# Patient Record
Sex: Male | Born: 1970 | Race: Black or African American | Hispanic: No | Marital: Single | Smoking: Never smoker
Health system: Southern US, Community
[De-identification: ages and names within clinical notes are randomized; demographics above are authoritative.]

## PROBLEM LIST (undated history)

## (undated) DIAGNOSIS — F329 Major depressive disorder, single episode, unspecified: Secondary | ICD-10-CM

## (undated) DIAGNOSIS — F32A Depression, unspecified: Secondary | ICD-10-CM

## (undated) DIAGNOSIS — M545 Low back pain, unspecified: Secondary | ICD-10-CM

## (undated) DIAGNOSIS — G8929 Other chronic pain: Secondary | ICD-10-CM

## (undated) DIAGNOSIS — K219 Gastro-esophageal reflux disease without esophagitis: Secondary | ICD-10-CM

## (undated) DIAGNOSIS — I10 Essential (primary) hypertension: Secondary | ICD-10-CM

## (undated) HISTORY — DX: Essential (primary) hypertension: I10

## (undated) HISTORY — DX: Depression, unspecified: F32.A

## (undated) HISTORY — DX: Major depressive disorder, single episode, unspecified: F32.9

## (undated) HISTORY — DX: Low back pain: M54.5

## (undated) HISTORY — DX: Gastro-esophageal reflux disease without esophagitis: K21.9

## (undated) HISTORY — DX: Low back pain, unspecified: M54.50

## (undated) HISTORY — DX: Other chronic pain: G89.29

---

## 1999-07-23 ENCOUNTER — Emergency Department (HOSPITAL_COMMUNITY): Admission: EM | Admit: 1999-07-23 | Discharge: 1999-07-23 | Payer: Self-pay | Admitting: Emergency Medicine

## 1999-07-23 ENCOUNTER — Encounter: Payer: Self-pay | Admitting: Emergency Medicine

## 2000-10-27 ENCOUNTER — Encounter: Payer: Self-pay | Admitting: Emergency Medicine

## 2000-10-27 ENCOUNTER — Emergency Department (HOSPITAL_COMMUNITY): Admission: EM | Admit: 2000-10-27 | Discharge: 2000-10-27 | Payer: Self-pay | Admitting: Emergency Medicine

## 2001-04-24 ENCOUNTER — Emergency Department (HOSPITAL_COMMUNITY): Admission: EM | Admit: 2001-04-24 | Discharge: 2001-04-24 | Payer: Self-pay | Admitting: Internal Medicine

## 2001-08-25 ENCOUNTER — Emergency Department (HOSPITAL_COMMUNITY): Admission: EM | Admit: 2001-08-25 | Discharge: 2001-08-26 | Payer: Self-pay

## 2002-01-25 ENCOUNTER — Emergency Department (HOSPITAL_COMMUNITY): Admission: EM | Admit: 2002-01-25 | Discharge: 2002-01-25 | Payer: Self-pay | Admitting: Emergency Medicine

## 2002-09-20 ENCOUNTER — Emergency Department (HOSPITAL_COMMUNITY): Admission: EM | Admit: 2002-09-20 | Discharge: 2002-09-21 | Payer: Self-pay | Admitting: Emergency Medicine

## 2004-11-11 ENCOUNTER — Emergency Department (HOSPITAL_COMMUNITY): Admission: EM | Admit: 2004-11-11 | Discharge: 2004-11-11 | Payer: Self-pay | Admitting: Emergency Medicine

## 2004-12-20 ENCOUNTER — Encounter: Payer: Self-pay | Admitting: Family Medicine

## 2005-11-05 ENCOUNTER — Encounter: Payer: Self-pay | Admitting: Family Medicine

## 2006-07-07 ENCOUNTER — Encounter: Payer: Self-pay | Admitting: Family Medicine

## 2007-06-06 ENCOUNTER — Emergency Department (HOSPITAL_COMMUNITY): Admission: EM | Admit: 2007-06-06 | Discharge: 2007-06-06 | Payer: Self-pay | Admitting: Emergency Medicine

## 2008-07-03 ENCOUNTER — Ambulatory Visit: Payer: Self-pay | Admitting: Family Medicine

## 2008-07-04 ENCOUNTER — Ambulatory Visit: Payer: Self-pay | Admitting: *Deleted

## 2009-01-02 ENCOUNTER — Emergency Department (HOSPITAL_COMMUNITY): Admission: EM | Admit: 2009-01-02 | Discharge: 2009-01-02 | Payer: Self-pay | Admitting: Emergency Medicine

## 2009-01-05 ENCOUNTER — Emergency Department (HOSPITAL_COMMUNITY): Admission: EM | Admit: 2009-01-05 | Discharge: 2009-01-05 | Payer: Self-pay | Admitting: Emergency Medicine

## 2009-04-17 ENCOUNTER — Emergency Department (HOSPITAL_COMMUNITY): Admission: EM | Admit: 2009-04-17 | Discharge: 2009-04-17 | Payer: Self-pay | Admitting: Emergency Medicine

## 2009-04-18 ENCOUNTER — Ambulatory Visit: Payer: Self-pay | Admitting: Family Medicine

## 2009-04-23 ENCOUNTER — Emergency Department (HOSPITAL_COMMUNITY): Admission: EM | Admit: 2009-04-23 | Discharge: 2009-04-23 | Payer: Self-pay | Admitting: Emergency Medicine

## 2009-04-25 ENCOUNTER — Ambulatory Visit: Payer: Self-pay | Admitting: Family Medicine

## 2009-05-01 ENCOUNTER — Encounter: Payer: Self-pay | Admitting: Family Medicine

## 2009-06-26 ENCOUNTER — Encounter
Admission: RE | Admit: 2009-06-26 | Discharge: 2009-07-02 | Payer: Self-pay | Admitting: Physical Medicine & Rehabilitation

## 2009-06-28 ENCOUNTER — Ambulatory Visit: Payer: Self-pay | Admitting: Physical Medicine & Rehabilitation

## 2009-06-28 ENCOUNTER — Encounter: Payer: Self-pay | Admitting: Family Medicine

## 2010-04-17 ENCOUNTER — Ambulatory Visit: Payer: Self-pay | Admitting: Family Medicine

## 2010-04-17 DIAGNOSIS — M545 Low back pain, unspecified: Secondary | ICD-10-CM | POA: Insufficient documentation

## 2010-04-17 DIAGNOSIS — I10 Essential (primary) hypertension: Secondary | ICD-10-CM | POA: Insufficient documentation

## 2010-04-17 DIAGNOSIS — K219 Gastro-esophageal reflux disease without esophagitis: Secondary | ICD-10-CM

## 2010-04-17 DIAGNOSIS — F329 Major depressive disorder, single episode, unspecified: Secondary | ICD-10-CM

## 2010-04-17 DIAGNOSIS — F3289 Other specified depressive episodes: Secondary | ICD-10-CM

## 2010-04-17 HISTORY — DX: Major depressive disorder, single episode, unspecified: F32.9

## 2010-04-17 HISTORY — DX: Gastro-esophageal reflux disease without esophagitis: K21.9

## 2010-04-17 HISTORY — DX: Other specified depressive episodes: F32.89

## 2010-04-17 HISTORY — DX: Essential (primary) hypertension: I10

## 2010-04-17 HISTORY — DX: Low back pain, unspecified: M54.50

## 2010-05-08 ENCOUNTER — Ambulatory Visit: Payer: Self-pay | Admitting: Family Medicine

## 2010-05-21 ENCOUNTER — Ambulatory Visit: Payer: Self-pay | Admitting: Family Medicine

## 2010-06-03 ENCOUNTER — Telehealth: Payer: Self-pay | Admitting: Family Medicine

## 2010-06-05 ENCOUNTER — Encounter: Admission: RE | Admit: 2010-06-05 | Discharge: 2010-07-09 | Payer: Self-pay | Admitting: Family Medicine

## 2010-06-10 ENCOUNTER — Encounter: Payer: Self-pay | Admitting: Family Medicine

## 2010-06-20 ENCOUNTER — Telehealth: Payer: Self-pay | Admitting: Family Medicine

## 2010-06-20 ENCOUNTER — Ambulatory Visit: Payer: Self-pay | Admitting: Family Medicine

## 2010-06-23 ENCOUNTER — Encounter: Payer: Self-pay | Admitting: Family Medicine

## 2010-06-24 ENCOUNTER — Telehealth: Payer: Self-pay | Admitting: Family Medicine

## 2010-07-02 ENCOUNTER — Telehealth: Payer: Self-pay | Admitting: Family Medicine

## 2010-07-06 ENCOUNTER — Encounter: Admission: RE | Admit: 2010-07-06 | Discharge: 2010-07-06 | Payer: Self-pay | Admitting: Family Medicine

## 2010-07-07 ENCOUNTER — Telehealth: Payer: Self-pay | Admitting: Family Medicine

## 2010-07-10 ENCOUNTER — Telehealth: Payer: Self-pay | Admitting: Family Medicine

## 2010-07-18 ENCOUNTER — Telehealth: Payer: Self-pay | Admitting: Family Medicine

## 2010-07-29 ENCOUNTER — Encounter: Payer: Self-pay | Admitting: Family Medicine

## 2010-11-13 NOTE — Progress Notes (Signed)
Summary: rtc  Phone Note Call from Patient Call back at Home Phone [phone]   Caller: Patient Call For: Evelena Peat MD Summary of Call: pt is rtc deb call Initial call taken by: Heron Sabins,  July 02, 2010 11:58 AM  Follow-up for Phone Call        Pt agrees to have the MRI, and is requesting the open table. Follow-up by: Lynann Beaver CMA,  July 02, 2010 12:38 PM

## 2010-11-13 NOTE — Progress Notes (Signed)
Summary: Physical Functional Capacity form faxed to pt attorney  Phone Note Call from Patient Call back at Home Phone [phone]   Caller: Patient---live call Call For: Bryce Peat MD Summary of Call: was seen this morning. checking on status of paperwork. is it ready? wants nurse to return his call.  Follow-up for Phone Call        Called pt, no it is not ready, we've been busy seeing patients Sid Falcon LPN  June 20, 2010 4:41 PM   Additional Follow-up for Phone Call Additional follow up Details #1::        Pt informed Physical residual Functional Capicity Questionnaire form filed out.  Pt wanted for to be faxed to his attorney.  Pt has signed a release allowing [place] to fax to his attorney.  All documents will be scanned to EMR.  Pt requested a copy for his records. Paperwork faxed, confirmation received. Additional Follow-up by: Sid Falcon LPN,  June 23, 2010 10:08 AM

## 2010-11-13 NOTE — Miscellaneous (Signed)
Summary: Initial Summary for PT Services  Initial Summary for PT Services   Imported By: Maryln Gottron 06/13/2010 10:49:29  _____________________________________________________________________  External Attachment:    Type:   Image     Comment:   External Document

## 2010-11-13 NOTE — Assessment & Plan Note (Signed)
Summary: discuss other physical therapy options//ccm   Vital Signs:  Patient profile:   40 year old male Weight:      217 pounds Temp:     98.2 degrees F oral BP sitting:   120 / 83  (left arm) Cuff size:   regular  Vitals Entered By: Sid Falcon LPN (June 20, 2010 11:01 AM) CC: discuss options for chronic low back pain Is Patient Diabetic? No   History of Present Illness: Refer to prior notes.  Back pain is essentially unchanged.  He continues to have severe daily almost  chronic pain.  PT thus far has not helped any.   No new symptoms.  Temporary relief of pain with hydrocodone. He has not had MRI since 2006.  Does have some L radiculopathy symptoms.  He brings in some forms today regarding disability.  He states he is unable to  lift over 10 pounds, climb, bend or stoop, or stand/sit for prolonged periods of time. He has tried NSAIDS  and Ultram without relief of his back pain.  Allergies: 1)  ! Penicillin V Potassium (Penicillin V Potassium)  Past History:  Past Medical History: Last updated: 04/17/2010 Depression GERD Chronic low back pain PMH reviewed for relevance  Review of Systems  The patient denies anorexia, fever, weight loss, peripheral edema, abdominal pain, incontinence, and muscle weakness.    Physical Exam  General:  Well-developed,well-nourished,in no acute distress; alert,appropriate and cooperative throughout examination Lungs:  Normal respiratory effort, chest expands symmetrically. Lungs are clear to auscultation, no crackles or wheezes. Heart:  normal rate and regular rhythm.   Msk:  pos L straight leg raise lying but not sitting. Pulses:  good distal foot pulses. Extremities:  no edema and no muscle atrophy noted. Neurologic:  DTRs remain symmetric knee and ankle. Pt seems to have weakness with hip flexion, knee flexion, knee extension, plantar and dorsi flexion.   Impression & Recommendations:  Problem # 1:  LOW BACK PAIN,  CHRONIC (ICD-724.2) cont PT.  We discussed plan to repeat MRI of L-S spine if no improvements in back pain over the next few weeks.  Consider refer to chronic pain management. His updated medication list for this problem includes:    Meloxicam 15 Mg Tabs (Meloxicam) ..... One by mouth once daily    Hydrocodone-acetaminophen 5-325 Mg Tabs (Hydrocodone-acetaminophen) .Marland Kitchen... 1-2 by mouth q 6 hours as needed severe pain  Complete Medication List: 1)  Meloxicam 15 Mg Tabs (Meloxicam) .... One by mouth once daily 2)  Hydrocodone-acetaminophen 5-325 Mg Tabs (Hydrocodone-acetaminophen) .Marland Kitchen.. 1-2 by mouth q 6 hours as needed severe pain Prescriptions: HYDROCODONE-ACETAMINOPHEN 5-325 MG TABS (HYDROCODONE-ACETAMINOPHEN) 1-2 by mouth q 6 hours as needed severe pain  #30 x 0   Entered and Authorized by:   Evelena Peat MD   Signed by:   Evelena Peat MD on 06/20/2010   Method used:   Print then Give to Patient   RxID:   [id number]

## 2010-11-13 NOTE — Letter (Signed)
Summary: Records from [facility] 2006 - 2007  Records from [facility] 2006 - 2007   Imported By: Maryln Gottron 04/24/2010 13:32:07  _____________________________________________________________________  External Attachment:    Type:   Image     Comment:   External Document

## 2010-11-13 NOTE — Progress Notes (Signed)
Summary: Pt called req MRI results. Req call back today  Phone Note Call from Patient Call back at [facility] Phone [phone]   Caller: Patient Summary of Call: Pt called req MRI results. Pls call today if possible asap.   Initial call taken by: Lucy Antigua,  July 07, 2010 4:49 PM  Follow-up for Phone Call        Pt not at home, call again tomorrow Sid Falcon LPN  July 07, 2010 5:29 PM   Additional Follow-up for Phone Call Additional follow up Details #1::        Pt called in to obtain results for recent MRI.... Pt can be reached at  [phone].  Additional Follow-up by: Debbra Riding,  July 08, 2010 12:23 PM    Additional Follow-up for Phone Call Additional follow up Details #2::    Pt discussing MRI results with Dr Caryl Never now Sid Falcon LPN  July 08, 2010 1:07 PM Spoke with pt .  Proceed with referral to [facility].  Follow-up by: Evelena Peat MD,  July 08, 2010 1:15 PM

## 2010-11-13 NOTE — Assessment & Plan Note (Signed)
Summary: fu on back pain/njr   Vital Signs:  Patient profile:   40 year old male Weight:      215 pounds Temp:     98.4 degrees F oral Pulse rate:   78 / minute BP sitting:   120 / 80  (left arm) Cuff size:   regular  Vitals Entered By: Romualdo Bolk, CMA (AAMA) (May 21, 2010 1:58 PM) CC: Follow-up visit on back pain- Pt states that the mobic is not working   History of Present Illness: Patient here for followup regarding back pain. We did finally receive some notes from pain management.  Patient was seen there last September. He had seen previous orthopedists and neurosurgeon. Very limited old records still for review. He had reported work injury 2006. 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 Patient has had chronic pain since then. Neurosurgery was not recommended by neurosurgeon. There had been some consideration of left S1 nerve root blocks but patient never went for that.  He has been on multiple medications per previous notes including Neurontin ,tramadol, muscle relaxers , and nonsteroidals without improvement. Small relief with hydrocodone and oxycodone.  Patient has daily pain 8/10 severity. Pain with prolonged periods of sitting, standing or walking. Pain is sharp ,stabbing, prickly, deep achy pain with radiation left lower extremity greater than right. Occasional radiation right buttock. No incontinence symptoms.  There have been recommendations to consider physical therapy but he never went. It was felt that deconditioning might improve with physical therapy.  Preventive Screening-Counseling & Management  Alcohol-Tobacco     Alcohol drinks/day: 0     Smoking Status: never  Caffeine-Diet-Exercise     Caffeine use/day: 0     Does Patient Exercise: no  Current Medications (verified): 1)  Meloxicam 15 Mg Tabs (Meloxicam) .... One By Mouth Once Daily  Allergies (verified): 1)  ! Penicillin V  Potassium (Penicillin V Potassium)  Past History:  Past Medical History: Last updated: 04/17/2010 Depression GERD Chronic low back pain PMH reviewed for relevance  Social History: Caffeine use/day:  0  Review of Systems      See HPI       The patient complains of weight gain.  The patient denies anorexia, fever, weight loss, chest pain, syncope, dyspnea on exertion, abdominal pain, melena, hematochezia, incontinence, and muscle weakness.    Physical Exam  General:  Well-developed,well-nourished,in no acute distress; alert,appropriate and cooperative throughout examination Lungs:  Normal respiratory effort, chest expands symmetrically. Lungs are clear to auscultation, no crackles or wheezes. Heart:  Normal rate and regular rhythm. S1 and S2 normal without gallop, murmur, click, rub or other extra sounds. Msk:  patient has very limited range of motion back secondary to pain. Extension only about 5 flexion of about 30 at best. Straight leg raises are negative. Neurologic:  deep reflexes remain 2+ knee and ankle bilaterally. No sensory impairment   Impression & Recommendations:  Problem # 1:  LOW BACK PAIN, CHRONIC (ICD-724.2) schedule PT.  Limited hydrocodone.  Schedule CPE. His updated medication list for this problem includes:    Meloxicam 15 Mg Tabs (Meloxicam) ..... One by mouth once daily    Hydrocodone-acetaminophen 5-325 Mg Tabs (Hydrocodone-acetaminophen) .Marland Kitchen... 1-2 by mouth q 6 hours as needed severe pain  Orders: Physical Therapy Referral (PT)  Complete Medication List: 1)  Meloxicam 15 Mg Tabs (Meloxicam) .... One by mouth once daily 2)  Hydrocodone-acetaminophen 5-325 Mg Tabs (Hydrocodone-acetaminophen) .Marland Kitchen.. 1-2 by  mouth q 6 hours as needed severe pain  Patient Instructions: 1)  Schedule followup appointment in 3-4 weeks Prescriptions: HYDROCODONE-ACETAMINOPHEN 5-325 MG TABS (HYDROCODONE-ACETAMINOPHEN) 1-2 by mouth q 6 hours as needed severe pain  #60 x 0    Entered and Authorized by:   Evelena Peat MD   Signed by:   Evelena Peat MD on 05/21/2010   Method used:   Print then Give to Patient   RxID:   [id number]

## 2010-11-13 NOTE — Letter (Signed)
Summary: Records from [facility] 2009 - 2010  Records from [facility] 2009 - 2010   Imported By: Maryln Gottron 06/12/2010 14:28:50  _____________________________________________________________________  External Attachment:    Type:   Image     Comment:   External Document

## 2010-11-13 NOTE — Assessment & Plan Note (Signed)
Summary: 3 wk rov/njr   Vital Signs:  Patient profile:   40 year old male Weight:      210 pounds Temp:     98.6 degrees F oral BP sitting:   118 / 84  (left arm) Cuff size:   large  Vitals Entered By: Sid Falcon LPN (May 08, 2010 9:59 AM) CC: 3 week follow-up, ongoing back pain   History of Present Illness: Patient is here for followup regarding chronic low back pain. We have requested old records and have received only one neurosurgical evaluation from neurosurgeon for visit back in 2007.  Patient apparently had a work injury in 2006. He saw orthopedist and at one point was offered surgery for L5-S1 disc.  back pain sharp stinging quality with radiation to left lower extremity. He had no imaging since 2006. He has frequent back spasms. Rate severity pain 8 out of 10. Worse with bending or lifting. Some pain at rest. Has tried multiple medications including Ultram, meloxicam, and nabumetone without relief. His only pain relief minimally with Percocet in the past.S1 disc protrusion. He has not worked since then. He continues to have chronic daily pain.  Has been seen at [facility] and [facility] previously. We have none of those records at this time. He apparently was discharged from pain management and not clear why. We have explained to patient that we are unable to prescribe any controlled medicines until we get to his records.  Allergies: 1)  ! Penicillin V Potassium (Penicillin V Potassium)  Past History:  Past Medical History: Last updated: 04/17/2010 Depression GERD Chronic low back pain  Physical Exam  General:  Well-developed,well-nourished,in no acute distress; alert,appropriate and cooperative throughout examination Neck:  No deformities, masses, or tenderness noted. Lungs:  Normal respiratory effort, chest expands symmetrically. Lungs are clear to auscultation, no crackles or wheezes. Heart:  Normal rate and regular rhythm. S1 and S2 normal without  gallop, murmur, click, rub or other extra sounds. Abdomen:  soft and non-tender.   Extremities:  no muscle atrophy. Straight leg raise is negative. Neurologic:  ankle and knee reflexes are 2+ bilaterally. Poor effort with strength testing throughout left lower extremity.   Impression & Recommendations:  Problem # 1:  LOW BACK PAIN, CHRONIC (ICD-724.2) Explained we are unable to prescribe controlled medications until we obtain more old records. He has chronic back pain over several years and would be best suited to be evaluated and treated in chronic pain management Center His updated medication list for this problem includes:    Meloxicam 15 Mg Tabs (Meloxicam) ..... One by mouth once daily  Complete Medication List: 1)  Meloxicam 15 Mg Tabs (Meloxicam) .... One by mouth once daily  Patient Instructions: 1)  We will send for old records from [facility] and [facility] and be in touch when those have arrived

## 2010-11-13 NOTE — Letter (Signed)
[facility]   Imported By: Maryln Gottron 05/26/2010 10:21:22  _____________________________________________________________________  External Attachment:    Type:   Image     Comment:   External Document

## 2010-11-13 NOTE — Progress Notes (Signed)
Summary: Pt req refill of Hydrocodone to [facility]  Phone Note Refill Request Call back at Home Phone [phone] Message from:  Patient on July 10, 2010 2:06 PM  Refills Requested: Medication #1:  HYDROCODONE-ACETAMINOPHEN 5-325 MG TABS 1-2 by mouth q 6 hours as needed severe pain.   Dosage confirmed as above?Dosage Confirmed Pt needs enough med to last until his appt with Neurologist on November 11,2011   Method Requested: pls call in to [facility] [address] [address] Initial call taken by: Lucy Antigua,  July 10, 2010 2:06 PM  Follow-up for Phone Call        We have discussed previously.  He was not on any opioids for years until he saw [place].  His recent MRI does not clearly explain his chronic L radiculopathy pain.  At this point I do not feel he should be on chronic opioids.  We tried Ultram without relief.  He should try Motrin or Alleve and   take with food. Follow-up by: Evelena Peat MD,  July 10, 2010 9:05 PM  Additional Follow-up for Phone Call Additional follow up Details #1::        Pt informed Additional Follow-up by: Sid Falcon LPN,  July 11, 2010 2:45 PM

## 2010-11-13 NOTE — Progress Notes (Signed)
Summary: med refill  Hydrocodone #30  Phone Note Call from Patient Call back at Home Phone [phone]   Refills Requested: Medication #1:  HYDROCODONE-ACETAMINOPHEN 5-325 MG TABS 1-2 by mouth q 6 hours as needed severe pain.   Dosage confirmed as above?Dosage Confirmed Caller: Patient Summary of Call: Pt called and is going to be starting Physical Therapy on 06/05/10. Pt is req that the mg dose of Hydrocodone be increased. Pls call in to [facility] on [address]  Pls notify pt when this has been done, or if there are questions.  Initial call taken by: Lucy Antigua,  June 03, 2010 1:06 PM  Follow-up for Phone Call        I do not recommend increased dose at this time.  Pt was not on ANY pain meds prior to starting this and we need to focus on PT and other non-narcotic treatments for his pain.  If he is not responding to PT we will plan on repeat MRI scan and go from there. Follow-up by: Evelena Peat MD,  June 03, 2010 1:49 PM  Additional Follow-up for Phone Call Additional follow up Details #1::        I called pt and told him that Dr. Caryl Never does not want to increase dose of pain at this time. Pt is req to get a refill of the Hydrocodone 5-325mg . Pt is completely out of med.        Additional Follow-up by: Lucy Antigua,  June 03, 2010 2:27 PM    Additional Follow-up for Phone Call Additional follow up Details #2::    Pt callback concerning  refill on hydrocodone [facility] [address]. Pt is aware doc out of office until tomorrow 06-04-10  Follow-up by: Heron Sabins,  June 03, 2010 4:41 PM  Additional Follow-up for Phone Call Additional follow up Details #3:: Details for Additional Follow-up Action Taken: will refill once (for hydrocodone) # 30 as patient starts phys therapy but we need to make clear to patient  that narcotic treatment should not be our first option.  If he is not seeing improvement with PT we will consider repeat MRI scan and consideration for  referral of specialist/pain management after that.  Rx called in, pt informed on personally identified work VM Sid Falcon LPN  June 05, 2010 9:36 AM  Additional Follow-up by: Evelena Peat MD,  June 05, 2010 8:18 AM  Prescriptions: HYDROCODONE-ACETAMINOPHEN 5-325 MG TABS (HYDROCODONE-ACETAMINOPHEN) 1-2 by mouth q 6 hours as needed severe pain  #30 x 0   Entered by:   Sid Falcon LPN   Authorized by:   Evelena Peat MD   Signed by:   Sid Falcon LPN on 91/47/8295   Method used:   Telephoned to ...       [facility]  [address] # [id number]* (retail)       [address]       [place], [place]  [address]       Ph: [phone] or [phone]       Fax: [phone]   RxID:   [id number]

## 2010-11-13 NOTE — Progress Notes (Signed)
Summary: denied refill pain med   Phone Note Refill Request Message from:  Fax from Pharmacy on July 18, 2010 2:25 PM  Refills Requested: Medication #1:  HYDROCODONE-ACETAMINOPHEN 5-325 MG TABS 1-2 by mouth q 6 hours as needed severe pain. [facility] [address]   Initial call taken by: Duard Brady LPN,  July 18, 2010 2:26 PM  Follow-up for Phone Call        [facility] - spoke with stephanie - the tech - informed that Dr. Caryl Never denied refill on hydrocodone acetaminophen and pt had been informed. KIk Follow-up by: Duard Brady LPN,  July 18, 2010 2:30 PM

## 2010-11-13 NOTE — Assessment & Plan Note (Signed)
Summary: BRAND NEW PT/TO EST/PT REQ CPX/PT FASTING/CJR   Vital Signs:  Patient profile:   40 year old male Height:      68.50 inches Weight:      212 pounds BMI:     31.88 Temp:     98.2 degrees F oral Pulse rate:   80 / minute Pulse rhythm:   regular Resp:     12 per minute BP sitting:   130 / 90  (left arm) Cuff size:   regular  Vitals Entered By: Sid Falcon LPN (April 17, 346 10:26 AM)  Nutrition Counseling: Patient's BMI is greater than 25 and therefore counseled on weight management options.  Serial Vital Signs/Assessments:  Time      Position  BP       Pulse  Resp  Temp     By 10:43 AM            140/82                         Sid Falcon LPN  CC: New to establish, ongoing back pain   History of Present Illness: Here to establish care.  Chronic back pain following work injury 2006.  Chronic pain L back with some radiculopathy symtpoms. Last MRI 2006.  Some weakness LLE.   Currently on no meds. Hx of rehab and pain management and at this time not clear why he was discharged from their care.  pain is rated 8/10 at times.  Pain with prolonged sitting or standing.  Advil and Alleve without relief. Prescription NSAIDS and tramadol without relief in past.   Has seen Dr Ethelene Hal and Dr Larna Daughters in past.  No hx of surgery.  Hx of reported mild GERD symptoms.  Controlled with OTC meds.  Preventive Screening-Counseling & Management  Alcohol-Tobacco     Alcohol drinks/day: 0     Smoking Status: never  Caffeine-Diet-Exercise     Does Patient Exercise: no  Allergies (verified): 1)  ! Penicillin V Potassium (Penicillin V Potassium)  Past History:  Family History: Last updated: 04/17/2010 mother, heart disease, hypertension, type ll diabetes  Social History: Last updated: 04/17/2010 Occupation:  Unemployed Single Never Smoked Alcohol use-no Regular exercise-no  Risk Factors: Alcohol Use: 0 (04/17/2010) Exercise: no (04/17/2010)  Risk Factors: Smoking  Status: never (04/17/2010)  Past Medical History: Depression GERD Chronic low back pain PMH-FH-SH reviewed for relevance  Family History: mother, heart disease, hypertension, type ll diabetes  Social History: Occupation:  Unemployed Single Never Smoked Alcohol use-no Regular exercise-no Smoking Status:  never Occupation:  employed Does Patient Exercise:  no  Review of Systems       The patient complains of weight gain.  The patient denies anorexia, fever, weight loss, chest pain, syncope, dyspnea on exertion, peripheral edema, prolonged cough, headaches, hemoptysis, abdominal pain, melena, hematochezia, and incontinence.    Physical Exam  General:  Well-developed,well-nourished,in no acute distress; alert,appropriate and cooperative throughout examination Head:  Normocephalic and atraumatic without obvious abnormalities. No apparent alopecia or balding. Eyes:  pupils equal, pupils round, and pupils reactive to light.   Mouth:  Oral mucosa and oropharynx without lesions or exudates.  Teeth in good repair. Neck:  No deformities, masses, or tenderness noted. Lungs:  Normal respiratory effort, chest expands symmetrically. Lungs are clear to auscultation, no crackles or wheezes. Heart:  Normal rate and regular rhythm. S1 and S2 normal without gallop, murmur, click, rub or other extra sounds. Msk:  straight  leg raises neg. Extremities:  no edema. Neurologic:  DTRs symmetrical and normal.  ?effort with strength testing LLE.  Normal sensory feet and legs.   Impression & Recommendations:  Problem # 1:  LOW BACK PAIN, CHRONIC (ICD-724.2) send for old records.  Start meloxicam and schedule follow up in one months.  pt inquires about pain meds.  We need to explore all non-opioid treatment options first. His updated medication list for this problem includes:    Meloxicam 15 Mg Tabs (Meloxicam) ..... One by mouth once daily  Problem # 2:  GERD (ICD-530.81)  Complete Medication  List: 1)  Meloxicam 15 Mg Tabs (Meloxicam) .... One by mouth once daily  Patient Instructions: 1)  Schedule follow up in 3-4 weeks. Prescriptions: MELOXICAM 15 MG TABS (MELOXICAM) one by mouth once daily  #30 x 3   Entered and Authorized by:   Evelena Peat MD   Signed by:   Evelena Peat MD on 04/17/2010   Method used:   Electronically to        [facility]  [address] [id number]* (retail)       [address]       [place], [place]  [address]       Ph: [phone] or [phone]       Fax: [phone]   RxID:   [id number]

## 2010-11-13 NOTE — Progress Notes (Signed)
Summary: Pt has some info re: form that was faxed  Phone Note Call from Patient Call back at Home Phone [phone]   Caller: Patient Summary of Call: Pt called and said that their was a question that Dr. Early Osmond didnt understand and pt has gotten the answer. Pls call.  Initial call taken by: Lucy Antigua,  June 24, 2010 11:28 AM  Follow-up for Phone Call        Pt informed question #14 completed yesterday and faxed to his attorney Follow-up by: Sid Falcon LPN,  June 24, 2010 12:38 PM

## 2010-11-13 NOTE — Letter (Signed)
Summary: Physical Residual Functional Capacity Questionnaire  Physical Residual Functional Capacity Questionnaire   Imported By: Maryln Gottron 06/27/2010 15:00:48  _____________________________________________________________________  External Attachment:    Type:   Image     Comment:   External Document

## 2010-11-13 NOTE — Letter (Signed)
Summary: [facility]   [facility]   Imported By: Maryln Gottron 05/13/2010 12:38:58  _____________________________________________________________________  External Attachment:    Type:   Image     Comment:   External Document

## 2010-11-14 NOTE — Miscellaneous (Signed)
Summary: Discharge Summary for PT [facility]  Discharge Summary for PT Services/[facility]   Imported By: Maryln Gottron 08/06/2010 10:39:53  _____________________________________________________________________  External Attachment:    Type:   Image     Comment:   External Document

## 2010-11-28 DIAGNOSIS — Z0289 Encounter for other administrative examinations: Secondary | ICD-10-CM

## 2010-12-01 ENCOUNTER — Encounter: Payer: Self-pay | Admitting: Family Medicine

## 2010-12-25 ENCOUNTER — Other Ambulatory Visit: Payer: Medicare Other | Admitting: Family Medicine

## 2010-12-25 DIAGNOSIS — R972 Elevated prostate specific antigen [PSA]: Secondary | ICD-10-CM

## 2010-12-25 DIAGNOSIS — E785 Hyperlipidemia, unspecified: Secondary | ICD-10-CM

## 2010-12-25 DIAGNOSIS — D649 Anemia, unspecified: Secondary | ICD-10-CM

## 2010-12-25 DIAGNOSIS — E039 Hypothyroidism, unspecified: Secondary | ICD-10-CM

## 2010-12-25 DIAGNOSIS — I10 Essential (primary) hypertension: Secondary | ICD-10-CM

## 2010-12-25 DIAGNOSIS — T887XXA Unspecified adverse effect of drug or medicament, initial encounter: Secondary | ICD-10-CM

## 2010-12-25 DIAGNOSIS — Z0000 Encounter for general adult medical examination without abnormal findings: Secondary | ICD-10-CM

## 2010-12-25 LAB — POCT URINALYSIS DIPSTICK
Bilirubin, UA: NEGATIVE
Blood, UA: NEGATIVE
Glucose, UA: NEGATIVE
Leukocytes, UA: NEGATIVE
Nitrite, UA: NEGATIVE

## 2010-12-25 LAB — TSH: TSH: 1.25 u[IU]/mL (ref 0.35–5.50)

## 2010-12-25 LAB — LDL CHOLESTEROL, DIRECT: Direct LDL: 184.9 mg/dL

## 2010-12-25 LAB — CBC WITH DIFFERENTIAL/PLATELET
Basophils Relative: 0.5 % (ref 0.0–3.0)
Eosinophils Relative: 5 % (ref 0.0–5.0)
HCT: 42.8 % (ref 39.0–52.0)
MCV: 84.5 fl (ref 78.0–100.0)
Monocytes Absolute: 0.4 10*3/uL (ref 0.1–1.0)
Neutrophils Relative %: 49.4 % (ref 43.0–77.0)
RBC: 5.06 Mil/uL (ref 4.22–5.81)
WBC: 4.9 10*3/uL (ref 4.5–10.5)

## 2010-12-25 LAB — HEPATIC FUNCTION PANEL
ALT: 25 U/L (ref 0–53)
Bilirubin, Direct: 0.1 mg/dL (ref 0.0–0.3)
Total Protein: 7 g/dL (ref 6.0–8.3)

## 2010-12-25 LAB — BASIC METABOLIC PANEL
Chloride: 107 mEq/L (ref 96–112)
Creatinine, Ser: 1.3 mg/dL (ref 0.4–1.5)
Potassium: 4.8 mEq/L (ref 3.5–5.1)

## 2010-12-25 LAB — PSA: PSA: 1.47 ng/mL (ref 0.10–4.00)

## 2010-12-25 LAB — LIPID PANEL: Triglycerides: 46 mg/dL (ref 0.0–149.0)

## 2011-01-06 ENCOUNTER — Encounter: Payer: Self-pay | Admitting: Family Medicine

## 2011-01-08 ENCOUNTER — Ambulatory Visit (INDEPENDENT_AMBULATORY_CARE_PROVIDER_SITE_OTHER): Payer: Medicare Other | Admitting: Family Medicine

## 2011-01-08 ENCOUNTER — Encounter: Payer: Self-pay | Admitting: Family Medicine

## 2011-01-08 VITALS — BP 104/80 | HR 80 | Temp 99.0°F | Resp 12 | Ht 69.0 in | Wt 203.0 lb

## 2011-01-08 DIAGNOSIS — Z0000 Encounter for general adult medical examination without abnormal findings: Secondary | ICD-10-CM

## 2011-01-08 DIAGNOSIS — M545 Low back pain: Secondary | ICD-10-CM

## 2011-01-08 DIAGNOSIS — G8929 Other chronic pain: Secondary | ICD-10-CM

## 2011-01-08 MED ORDER — TETANUS-DIPHTH-ACELL PERTUSSIS 5-2.5-18.5 LF-MCG/0.5 IM SUSP: 0.5000 mL | Freq: Once | INTRAMUSCULAR | Status: AC

## 2011-01-08 MED ORDER — HYDROCODONE-ACETAMINOPHEN 5-325 MG PO TABS: 1.0000 | ORAL_TABLET | Freq: Four times a day (QID) | ORAL | Status: DC | PRN

## 2011-01-08 NOTE — Progress Notes (Signed)
  Subjective:    Patient ID: Bryce Cabrera, male    DOB: 02/10/1971, 40 y.o.   MRN: [id number]  HPI Patient is seen for physical exam/wellness visit.  Also has chronic low back pain.  He has recently lost some weight due to his efforts.  Recently approved for disability (per pt) based on some chronic low back pain.  Uses TENS unit but still has significant pain.  Very limited exercise. Low back pain is generalized and no recent weakness.  Has not seen relief previously with NSAIDS or tramadol.  Pain worse with back flexion and change of position.  Tetanus unknown but he feels over 10 years ago.  PMH, SH, AND FH reviewed.   Review of Systems  Constitutional: Negative for fever, activity change, appetite change and fatigue.  HENT: Negative for ear pain, congestion and trouble swallowing.   Eyes: Negative for pain and visual disturbance.  Respiratory: Negative for cough, shortness of breath and wheezing.   Cardiovascular: Negative for chest pain and palpitations.  Gastrointestinal: Negative for nausea, vomiting, abdominal pain, diarrhea, constipation, blood in stool, abdominal distention and rectal pain.  Genitourinary: Negative for dysuria, hematuria and testicular pain.  Musculoskeletal: Positive for back pain. Negative for joint swelling and arthralgias.  Skin: Negative for rash.  Neurological: Negative for dizziness, syncope and headaches.  Hematological: Negative for adenopathy.  Psychiatric/Behavioral: Negative for confusion and dysphoric mood.       Objective:   Physical Exam  Constitutional: He is oriented to person, place, and time. He appears well-developed and well-nourished. No distress.  HENT:  Head: Normocephalic and atraumatic.  Right Ear: External ear normal.  Left Ear: External ear normal.  Mouth/Throat: Oropharynx is clear and moist.  Eyes: Conjunctivae and EOM are normal. Pupils are equal, round, and reactive to light.  Neck: Normal range of motion. Neck supple.  No thyromegaly present.  Cardiovascular: Normal rate, regular rhythm and normal heart sounds.   No murmur heard. Pulmonary/Chest: No respiratory distress. He has no wheezes. He has no rales.  Abdominal: Soft. Bowel sounds are normal. He exhibits no distension and no mass. There is no tenderness. There is no rebound and no guarding.  Musculoskeletal: He exhibits no edema.  Lymphadenopathy:    He has no cervical adenopathy.  Neurological: He is alert and oriented to person, place, and time. He displays normal reflexes. No cranial nerve deficit.  Skin: No rash noted.  Psychiatric: He has a normal mood and affect.          Assessment & Plan:  #1  Wellness exam.  Labs reviewed with pt.  Elevated lipids.  He prefers attempt at further weight loss and reassess in 6 months.  Tdap given. #2  Chronic low back pain.  Pt requesting additional pain meds.  We wrote for limited Vicodin but expressed our concern about long term opioids.

## 2011-01-08 NOTE — Patient Instructions (Signed)
Continue to work on weight loss and we need to repeat your lipids in 6 months.

## 2011-01-14 ENCOUNTER — Telehealth: Payer: Self-pay | Admitting: Family Medicine

## 2011-01-14 DIAGNOSIS — M545 Low back pain: Secondary | ICD-10-CM

## 2011-01-14 NOTE — Telephone Encounter (Signed)
Pt has 8 pills remaining on the Hydrocodone. Pt has been taking 2 every 6 hrs for pain. Pt is req refill. Pt doesn't have another ov with Dr Caryl Never for 6 month. Pt req refills to last until appt.

## 2011-01-15 NOTE — Telephone Encounter (Signed)
This cannot be refilled at this time.  I have had multiple discussions with patient. He should not be taking this frequently.  I am uncomfortable with long term opioid use with his situation.  I am happy to refer back to pain management if he is interested.

## 2011-01-15 NOTE — Telephone Encounter (Signed)
Pt informed and he is in agreement for referral to [facility].  He does not want to go back to pain management he has been to in the past

## 2011-01-15 NOTE — Telephone Encounter (Signed)
OK to proceed with referral to [facility].

## 2011-01-15 NOTE — Telephone Encounter (Signed)
Last filled on 3/29, #40 with 0 refills

## 2011-01-22 LAB — DIFFERENTIAL
Basophils Absolute: 0 10*3/uL (ref 0.0–0.1)
Basophils Relative: 0 % (ref 0–1)
Lymphocytes Relative: 7 % — ABNORMAL LOW (ref 12–46)
Monocytes Absolute: 1.6 10*3/uL — ABNORMAL HIGH (ref 0.1–1.0)
Neutro Abs: 12.9 10*3/uL — ABNORMAL HIGH (ref 1.7–7.7)

## 2011-01-22 LAB — POCT I-STAT, CHEM 8
Calcium, Ion: 1.15 mmol/L (ref 1.12–1.32)
Chloride: 104 mEq/L (ref 96–112)
HCT: 51 % (ref 39.0–52.0)
Sodium: 138 mEq/L (ref 135–145)
TCO2: 26 mmol/L (ref 0–100)

## 2011-01-22 LAB — CBC
Hemoglobin: 15.6 g/dL (ref 13.0–17.0)
MCHC: 33.1 g/dL (ref 30.0–36.0)
Platelets: 247 10*3/uL (ref 150–400)
RDW: 14.4 % (ref 11.5–15.5)

## 2011-01-22 LAB — RAPID STREP SCREEN (MED CTR MEBANE ONLY): Streptococcus, Group A Screen (Direct): NEGATIVE

## 2011-02-24 NOTE — Consult Note (Signed)
NAMEORIAN, FIGUEIRA NO.:  [id number]   MEDICAL RECORD NO.:  [id number]          PATIENT TYPE:  EMS   LOCATION:  ED                           FACILITY:  [facility]   PHYSICIAN:  Antony Contras, MD     DATE OF BIRTH:  01-27-1971   DATE OF CONSULTATION:  01/05/2009  DATE OF DISCHARGE:                                 CONSULTATION   CHIEF COMPLAINT:  Throat pain.   HISTORY OF PRESENT ILLNESS:  The patient is a 40 year old African  American male who developed a sore throat with swelling about 5 days ago  and was found to have pus on his tonsils.  He presented to the emergency  department 3 days ago with those symptoms and was prescribed  azithromycin.  He has not noticed any improvement.  In fact, has gotten  worse to the point of difficulty swallowing due to pain and swelling as  well as some difficulty breathing.  Pain is on the left side and, when  he presented to the emergency department today, his pain was 10/10.  After medications, it is now 2/10.  His voice is hoarse but he is  otherwise without complaint.   PAST MEDICAL HISTORY:  Chronic back pain.   MEDICATIONS:  Azithromycin.   ALLERGIES:  PENICILLIN.   SOCIAL HISTORY:  The patient denies smoking, alcohol use or drug abuse.   FAMILY HISTORY:  Heart disease, diabetes, cancer.   REVIEW OF SYSTEMS:  Negative except as listed above.   PHYSICAL EXAMINATION:  VITAL SIGNS:  Temperature 98.6, T-max 101.2.  Vital signs stable.  GENERAL:  The patient is in no acute distress and is pleasant and  cooperative.  EYES:  Extraocular movements are intact.  The pupils are equally round  and reactive to light.  NOSE:  External nose is normal.  Nasal passages are patent.  The nasal  septum is relatively midline.  EARS:  External ears are normal and external canals are patent.  Tympanic membranes are intact and middle ear spaces are aerated.  ORAL CAVITY/OROPHARYNX:  The patient has mild trismus.  The left  peritonsillar region is full.  The tonsils are without exudate.  The  teeth, floor of mouth, tongue are normal.  NECK:  Nontender without mass.  LYMPHATICS:  There are no significantly enlarged lymph nodes palpated in  the neck.  THYROID:  Normal to palpation.  SALIVARY GLANDS:  Normal to palpation.  NEURO:  Cranial nerves II-XII grossly intact.   LABORATORIES:  White blood count 15.6.   ASSESSMENT:  The patient is a 40 year old African American male with a  left peritonsillar abscess.   PLAN:  The patient underwent a neck CT scan that showed a 3-cm  peritonsillar abscess on the left side.  The abscess will be drained in  the emergency department under local anesthesia.  Afterwards, he will be  discharged to home on oral clindamycin.  The risks, benefits,  alternatives of the procedure were discussed.  Follow up with me in 1  week in my office.      Antony Contras, MD  Electronically Signed  DDB/MEDQ  D:  01/05/2009  T:  01/05/2009  Job:  [id number]   cc:   Antony Contras, MD  Fax: [phone]

## 2011-02-24 NOTE — Op Note (Signed)
NAMEEPHREM, CARRICK NO.:  [id number]   MEDICAL RECORD NO.:  [id number]          PATIENT TYPE:  EMS   LOCATION:  ED                           FACILITY:  [facility]   PHYSICIAN:  Antony Contras, MD     DATE OF BIRTH:  30-Mar-1971   DATE OF PROCEDURE:  01/05/2009  DATE OF DISCHARGE:                               OPERATIVE REPORT   PREOPERATIVE DIAGNOSIS:  Left peritonsillar abscess.   POSTOPERATIVE DIAGNOSIS:  Left peritonsillar abscess.   PROCEDURE:  Incision and drainage of left peritonsillar abscess.   ANESTHESIA:  Local.   COMPLICATIONS:  None.   INDICATIONS:  The patient is a 40 year old African American male who has  a 5-day history of sore throat, was diagnosed with strep throat.  He has  been treated with azithromycin, but has had progression of symptoms, now  with left-sided pain and swelling.  He was found to have peritonsillar  abscess.   FINDINGS:  The left peritonsillar region is quite edematous.  Upon  opening of the abscess, yellow pus was freely drained.   DESCRIPTION OF PROCEDURE:  The patient was identified in the emergency  department and risks, benefits, alternatives were discussed.  The left  part of the oropharynx was sprayed with Cetacaine on 2 occasions.  The  left side was then injected with 1% lidocaine with 1:100,000  epinephrine.  A horizontal incision was made with an 11 blade scalpel  and was then probed with a curved hemostat until the abscess was  entered.  Pus immediately drained and was suctioned by the patient.  Additional probing was performed to clear of all the pus.  The patient  tolerated procedure without complications.      Antony Contras, MD  Electronically Signed     DDB/MEDQ  D:  01/05/2009  T:  01/05/2009  Job:  [id number]
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
## 2011-06-16 ENCOUNTER — Other Ambulatory Visit: Payer: Medicare Other

## 2011-06-30 ENCOUNTER — Ambulatory Visit: Payer: Medicare Other | Admitting: Family Medicine

## 2012-03-17 ENCOUNTER — Emergency Department (HOSPITAL_COMMUNITY): Payer: No Typology Code available for payment source

## 2012-03-17 ENCOUNTER — Encounter (HOSPITAL_COMMUNITY): Payer: Self-pay | Admitting: Emergency Medicine

## 2012-03-17 ENCOUNTER — Emergency Department (HOSPITAL_COMMUNITY)
Admission: EM | Admit: 2012-03-17 | Discharge: 2012-03-17 | Disposition: A | Discharge status: Home / Self Care | Payer: No Typology Code available for payment source | Attending: Emergency Medicine | Admitting: Emergency Medicine

## 2012-03-17 DIAGNOSIS — I10 Essential (primary) hypertension: Secondary | ICD-10-CM | POA: Insufficient documentation

## 2012-03-17 DIAGNOSIS — Y92410 Unspecified street and highway as the place of occurrence of the external cause: Secondary | ICD-10-CM | POA: Insufficient documentation

## 2012-03-17 DIAGNOSIS — F329 Major depressive disorder, single episode, unspecified: Secondary | ICD-10-CM | POA: Insufficient documentation

## 2012-03-17 DIAGNOSIS — K219 Gastro-esophageal reflux disease without esophagitis: Secondary | ICD-10-CM | POA: Insufficient documentation

## 2012-03-17 DIAGNOSIS — F3289 Other specified depressive episodes: Secondary | ICD-10-CM | POA: Insufficient documentation

## 2012-03-17 DIAGNOSIS — S335XXA Sprain of ligaments of lumbar spine, initial encounter: Secondary | ICD-10-CM | POA: Insufficient documentation

## 2012-03-17 DIAGNOSIS — M549 Dorsalgia, unspecified: Secondary | ICD-10-CM

## 2012-03-17 DIAGNOSIS — S39012A Strain of muscle, fascia and tendon of lower back, initial encounter: Secondary | ICD-10-CM
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
## 2012-03-17 NOTE — ED Provider Notes (Signed)
Medical screening examination/treatment/procedure(s) were performed by non-physician practitioner and as supervising physician I was immediately available for consultation/collaboration.   Forbes Cellar, MD 03/17/12 1243
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
## 2012-04-22 ENCOUNTER — Other Ambulatory Visit: Payer: Self-pay | Admitting: Family Medicine

## 2012-04-22 ENCOUNTER — Encounter (HOSPITAL_COMMUNITY): Payer: Self-pay | Admitting: Emergency Medicine

## 2012-04-22 ENCOUNTER — Emergency Department (HOSPITAL_COMMUNITY)
Admission: EM | Admit: 2012-04-22 | Discharge: 2012-04-22 | Disposition: A | Discharge status: Home / Self Care | Payer: PRIVATE HEALTH INSURANCE | Attending: Emergency Medicine | Admitting: Emergency Medicine

## 2012-04-22 DIAGNOSIS — M545 Low back pain, unspecified: Secondary | ICD-10-CM | POA: Insufficient documentation

## 2012-04-22 DIAGNOSIS — I10 Essential (primary) hypertension: Secondary | ICD-10-CM | POA: Insufficient documentation

## 2012-04-22 DIAGNOSIS — K219 Gastro-esophageal reflux disease without esophagitis: Secondary | ICD-10-CM | POA: Insufficient documentation

## 2012-04-22 DIAGNOSIS — G8929 Other chronic pain: Secondary | ICD-10-CM | POA: Insufficient documentation

## 2012-04-22 DIAGNOSIS — M542 Cervicalgia: Secondary | ICD-10-CM | POA: Insufficient documentation

## 2012-04-22 MED ORDER — ETODOLAC 500 MG PO TABS: 500.0000 mg | ORAL_TABLET | Freq: Two times a day (BID) | ORAL | Status: AC
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
## 2012-04-27 ENCOUNTER — Ambulatory Visit
Admission: RE | Admit: 2012-04-27 | Discharge: 2012-04-27 | Disposition: A | Discharge status: Home / Self Care | Payer: Medicare Other | Source: Ambulatory Visit | Attending: Family Medicine | Admitting: Family Medicine

## 2012-04-27 DIAGNOSIS — M545 Low back pain: Secondary | ICD-10-CM

## 2012-04-30 ENCOUNTER — Other Ambulatory Visit: Payer: Medicare Other

## 2015-01-08 ENCOUNTER — Encounter (HOSPITAL_COMMUNITY): Payer: Self-pay | Admitting: *Deleted

## 2015-01-08 ENCOUNTER — Emergency Department (HOSPITAL_COMMUNITY): Payer: Medicare Other

## 2015-01-08 ENCOUNTER — Emergency Department (HOSPITAL_COMMUNITY)
Admission: EM | Admit: 2015-01-08 | Discharge: 2015-01-08 | Disposition: A | Discharge status: Home / Self Care | Payer: Medicare Other | Attending: Emergency Medicine | Admitting: Emergency Medicine

## 2015-01-08 DIAGNOSIS — G8929 Other chronic pain: Secondary | ICD-10-CM | POA: Diagnosis not present

## 2015-01-08 DIAGNOSIS — Z880 Allergy status to penicillin: Secondary | ICD-10-CM | POA: Insufficient documentation

## 2015-01-08 DIAGNOSIS — Z79899 Other long term (current) drug therapy: Secondary | ICD-10-CM | POA: Diagnosis not present

## 2015-01-08 DIAGNOSIS — R05 Cough: Secondary | ICD-10-CM | POA: Diagnosis present

## 2015-01-08 DIAGNOSIS — Z8659 Personal history of other mental and behavioral disorders: Secondary | ICD-10-CM | POA: Diagnosis not present

## 2015-01-08 DIAGNOSIS — Z8719 Personal history of other diseases of the digestive system: Secondary | ICD-10-CM | POA: Insufficient documentation

## 2015-01-08 DIAGNOSIS — J069 Acute upper respiratory infection, unspecified: Secondary | ICD-10-CM | POA: Diagnosis not present

## 2015-01-08 DIAGNOSIS — I10 Essential (primary) hypertension: Secondary | ICD-10-CM | POA: Insufficient documentation

## 2015-01-08 DIAGNOSIS — Z7951 Long term (current) use of inhaled steroids: Secondary | ICD-10-CM | POA: Diagnosis not present

## 2015-01-08 DIAGNOSIS — J029 Acute pharyngitis, unspecified: Secondary | ICD-10-CM | POA: Diagnosis not present

## 2015-01-08 LAB — RAPID STREP SCREEN (MED CTR MEBANE ONLY): Streptococcus, Group A Screen (Direct): NEGATIVE

## 2015-01-08 MED ORDER — BENZONATATE 100 MG PO CAPS: 100.0000 mg | ORAL_CAPSULE | Freq: Three times a day (TID) | ORAL | Status: AC

## 2015-01-08 MED ORDER — SALINE SPRAY 0.65 % NA SOLN: 1.0000 | NASAL | Status: AC | PRN

## 2015-01-08 MED ORDER — FLUTICASONE PROPIONATE 50 MCG/ACT NA SUSP: 2.0000 | Freq: Every day | NASAL | Status: AC

## 2015-01-08 NOTE — Discharge Instructions (Signed)
Take Tessalon as directed for cough. Use nasal spray as prescribed. Use nasal saline intermittently throughout the day along with saltwater gargles.  Pharyngitis Pharyngitis is a sore throat (pharynx). There is redness, pain, and swelling of your throat. HOME CARE   Drink enough fluids to keep your pee (urine) clear or pale yellow.  Only take medicine as told by your doctor.  You may get sick again if you do not take medicine as told. Finish your medicines, even if you start to feel better.  Do not take aspirin.  Rest.  Rinse your mouth (gargle) with salt water ( tsp of salt per 1 qt of water) every 1-2 hours. This will help the pain.  If you are not at risk for choking, you can suck on hard candy or sore throat lozenges. GET HELP IF:  You have large, tender lumps on your neck.  You have a rash.  You cough up green, yellow-brown, or bloody spit. GET HELP RIGHT AWAY IF:   You have a stiff neck.  You drool or cannot swallow liquids.  You throw up (vomit) or are not able to keep medicine or liquids down.  You have very bad pain that does not go away with medicine.  You have problems breathing (not from a stuffy nose). MAKE SURE YOU:   Understand these instructions.  Will watch your condition.  Will get help right away if you are not doing well or get worse. Document Released: 03/16/2008 Document Revised: 07/19/2013 Document Reviewed: 06/05/2013 [facility] Patient Information 2015 [place], [place]. This information is not intended to replace advice given to you by your health care provider. Make sure you discuss any questions you have with your health care provider.  Salt Water Gargle This solution will help make your mouth and throat feel better. HOME CARE INSTRUCTIONS   Mix 1 teaspoon of salt in 8 ounces of warm water.  Gargle with this solution as much or often as you need or as directed. Swish and gargle gently if you have any sores or wounds in your mouth.  Do  not swallow this mixture. Document Released: 07/02/2004 Document Revised: 12/21/2011 Document Reviewed: 11/23/2008 [facility] Patient Information 2015 [place], [place]. This information is not intended to replace advice given to you by your health care provider. Make sure you discuss any questions you have with your health care provider.  Upper Respiratory Infection, Adult An upper respiratory infection (URI) is also sometimes known as the common cold. The upper respiratory tract includes the nose, sinuses, throat, trachea, and bronchi. Bronchi are the airways leading to the lungs. Most people improve within 1 week, but symptoms can last up to 2 weeks. A residual cough may last even longer.  CAUSES Many different viruses can infect the tissues lining the upper respiratory tract. The tissues become irritated and inflamed and often become very moist. Mucus production is also common. A cold is contagious. You can easily spread the virus to others by oral contact. This includes kissing, sharing a glass, coughing, or sneezing. Touching your mouth or nose and then touching a surface, which is then touched by another person, can also spread the virus. SYMPTOMS  Symptoms typically develop 1 to 3 days after you come in contact with a cold virus. Symptoms vary from person to person. They may include:  Runny nose.  Sneezing.  Nasal congestion.  Sinus irritation.  Sore throat.  Loss of voice (laryngitis).  Cough.  Fatigue.  Muscle aches.  Loss of appetite.  Headache.  Low-grade fever. DIAGNOSIS  You might diagnose your own cold based on familiar symptoms, since most people get a cold 2 to 3 times a year. Your caregiver can confirm this based on your exam. Most importantly, your caregiver can check that your symptoms are not due to another disease such as strep throat, sinusitis, pneumonia, asthma, or epiglottitis. Blood tests, throat tests, and X-rays are not necessary to diagnose a common cold,  but they may sometimes be helpful in excluding other more serious diseases. Your caregiver will decide if any further tests are required. RISKS AND COMPLICATIONS  You may be at risk for a more severe case of the common cold if you smoke cigarettes, have chronic heart disease (such as heart failure) or lung disease (such as asthma), or if you have a weakened immune system. The very young and very old are also at risk for more serious infections. Bacterial sinusitis, middle ear infections, and bacterial pneumonia can complicate the common cold. The common cold can worsen asthma and chronic obstructive pulmonary disease (COPD). Sometimes, these complications can require emergency medical care and may be life-threatening. PREVENTION  The best way to protect against getting a cold is to practice good hygiene. Avoid oral or hand contact with people with cold symptoms. Wash your hands often if contact occurs. There is no clear evidence that vitamin C, vitamin E, echinacea, or exercise reduces the chance of developing a cold. However, it is always recommended to get plenty of rest and practice good nutrition. TREATMENT  Treatment is directed at relieving symptoms. There is no cure. Antibiotics are not effective, because the infection is caused by a virus, not by bacteria. Treatment may include:  Increased fluid intake. Sports drinks offer valuable electrolytes, sugars, and fluids.  Breathing heated mist or steam (vaporizer or shower).  Eating chicken soup or other clear broths, and maintaining good nutrition.  Getting plenty of rest.  Using gargles or lozenges for comfort.  Controlling fevers with ibuprofen or acetaminophen as directed by your caregiver.  Increasing usage of your inhaler if you have asthma. Zinc gel and zinc lozenges, taken in the first 24 hours of the common cold, can shorten the duration and lessen the severity of symptoms. Pain medicines may help with fever, muscle aches, and throat  pain. A variety of non-prescription medicines are available to treat congestion and runny nose. Your caregiver can make recommendations and may suggest nasal or lung inhalers for other symptoms.  HOME CARE INSTRUCTIONS   Only take over-the-counter or prescription medicines for pain, discomfort, or fever as directed by your caregiver.  Use a warm mist humidifier or inhale steam from a shower to increase air moisture. This may keep secretions moist and make it easier to breathe.  Drink enough water and fluids to keep your urine clear or pale yellow.  Rest as needed.  Return to work when your temperature has returned to normal or as your caregiver advises. You may need to stay home longer to avoid infecting others. You can also use a face mask and careful hand washing to prevent spread of the virus. SEEK MEDICAL CARE IF:   After the first few days, you feel you are getting worse rather than better.  You need your caregiver's advice about medicines to control symptoms.  You develop chills, worsening shortness of breath, or brown or red sputum. These may be signs of pneumonia.  You develop yellow or brown nasal discharge or pain in the face, especially when you bend forward. These  may be signs of sinusitis.  You develop a fever, swollen neck glands, pain with swallowing, or white areas in the back of your throat. These may be signs of strep throat. SEEK IMMEDIATE MEDICAL CARE IF:   You have a fever.  You develop severe or persistent headache, ear pain, sinus pain, or chest pain.  You develop wheezing, a prolonged cough, cough up blood, or have a change in your usual mucus (if you have chronic lung disease).  You develop sore muscles or a stiff neck. Document Released: 03/24/2001 Document Revised: 12/21/2011 Document Reviewed: 01/03/2014 [facility] Patient Information 2015 [place], [place]. This information is not intended to replace advice given to you by your health care provider. Make sure  you discuss any questions you have with your health care provider.

## 2015-01-08 NOTE — ED Notes (Signed)
Pt reports productive cough and sore throat x4 days. Throat pain 6/10.

## 2015-01-08 NOTE — ED Provider Notes (Signed)
CSN: [id number]     Arrival date & time 01/08/15  1258 History  This chart was scribed for non-physician practitioner, Celene Skeen, PA-C working with Raeford Razor, MD by Greggory Stallion, ED scribe. This patient was seen in room WTR7/WTR7 and the patient's care was started at 1:40 PM.   Chief Complaint  Patient presents with  . Cough  . Sore Throat   The history is provided by the patient. No language interpreter was used.    HPI Comments: Bryce Cabrera is a 44 y.o. male who presents to the Emergency Department complaining of sore throat and productive cough of brown sputum that started 4 days ago. He has not yet taken any medications. There are no alleviating factors. Pt denies fever, hemoptysis. He was recently a visitor in the hospital and thinks he might have gotten something from someone there. Pt is not a smoker.   Past Medical History  Diagnosis Date  . Depression   . GERD (gastroesophageal reflux disease)   . Chronic LBP   . DEPRESSION 04/17/2010  . HYPERTENSION 04/17/2010  . GERD 04/17/2010  . LOW BACK PAIN, CHRONIC 04/17/2010   History reviewed. No pertinent past surgical history. Family History  Problem Relation Age of Onset  . Diabetes Mother   . Heart disease Mother   . Hyperlipidemia Mother    History  Substance Use Topics  . Smoking status: Never Smoker   . Smokeless tobacco: Not on file  . Alcohol Use: Yes    Review of Systems  Constitutional: Negative for fever.  HENT: Positive for sore throat.   Respiratory: Positive for cough.   All other systems reviewed and are negative.   Allergies  Aspirin and Penicillins  Home Medications   Prior to Admission medications   Medication Sig Start Date End Date Taking? Authorizing Provider  benzonatate (TESSALON) 100 MG capsule Take 1 capsule (100 mg total) by mouth every 8 (eight) hours. 01/08/15   Kenndra Morris M Erielle Gawronski, PA-C  fluticasone (FLONASE) 50 MCG/ACT nasal spray Place 2 sprays into both nostrils daily. 01/08/15   Alfons Sulkowski  M Jhamir Pickup, PA-C  oxyCODONE (OXYCONTIN) 15 MG TB12 Take 15 mg by mouth 3 (three) times daily.    Historical Provider, MD  sodium chloride (OCEAN) 0.65 % SOLN nasal spray Place 1 spray into both nostrils as needed for congestion. 01/08/15   Athleen Feltner M Kahlia Lagunes, PA-C   BP 133/81 mmHg  Pulse 93  Temp(Src) 98.1 F (36.7 C) (Oral)  Resp 18  SpO2 98%   Physical Exam  Constitutional: He is oriented to person, place, and time. He appears well-developed and well-nourished. No distress.  HENT:  Head: Normocephalic and atraumatic.  Mouth/Throat: Uvula is midline. Posterior oropharyngeal edema and posterior oropharyngeal erythema present. No oropharyngeal exudate.  Eyes: Conjunctivae and EOM are normal.  Neck: Normal range of motion. Neck supple.  Cardiovascular: Normal rate, regular rhythm and normal heart sounds.   Pulmonary/Chest: Effort normal and breath sounds normal.  Musculoskeletal: Normal range of motion. He exhibits no edema.  Lymphadenopathy:    He has cervical adenopathy.  Neurological: He is alert and oriented to person, place, and time.  Skin: Skin is warm and dry.  Psychiatric: He has a normal mood and affect. His behavior is normal.  Nursing note and vitals reviewed.   ED Course  Procedures (including critical care time)  DIAGNOSTIC STUDIES: Oxygen Saturation is 98% on RA, normal by my interpretation.    COORDINATION OF CARE: 1:42 PM-Discussed treatment plan which includes rapid strep  test and chest xray with pt at bedside and pt agreed to plan.   Labs Review Labs Reviewed  RAPID STREP SCREEN  CULTURE, GROUP A STREP    Imaging Review Dg Chest 2 View  01/08/2015   CLINICAL DATA:  Cough 4 days.  EXAM: CHEST  2 VIEW  COMPARISON:  None.  FINDINGS: The heart size and mediastinal contours are within normal limits. Both lungs are clear. The visualized skeletal structures are unremarkable.  IMPRESSION: No active cardiopulmonary disease.   Electronically Signed   By: Elige KoHetal  Patel   On:  01/08/2015 13:54     EKG Interpretation None      MDM   Final diagnoses:  URI (upper respiratory infection)  Pharyngitis   NAD. VSS. Given reported fevers, and productive cough with brown phlegm, chest x-ray obtained and negative. Rapid strep negative. Discussed symptomatic treatment. Follow-up with PCP. Stable for discharge. Return precautions given. Patient states understanding of treatment care plan and is agreeable.  I personally performed the services described in this documentation, which was scribed in my presence. The recorded information has been reviewed and is accurate.  Kathrynn SpeedRobyn M Cybil [place], PA-C 01/08/15 1418  Raeford RazorStephen Kohut, MD 01/09/15 [phone]

## 2015-01-10 LAB — CULTURE, GROUP A STREP: Strep A Culture: NEGATIVE

## 2015-11-19 IMAGING — CR DG CHEST 2V
2 series · 2 of 2 positions shown · non-contrast
Comparison: None.

CLINICAL DATA: Cough 4 days.

EXAM:
CHEST  2 VIEW

[w chest pa]
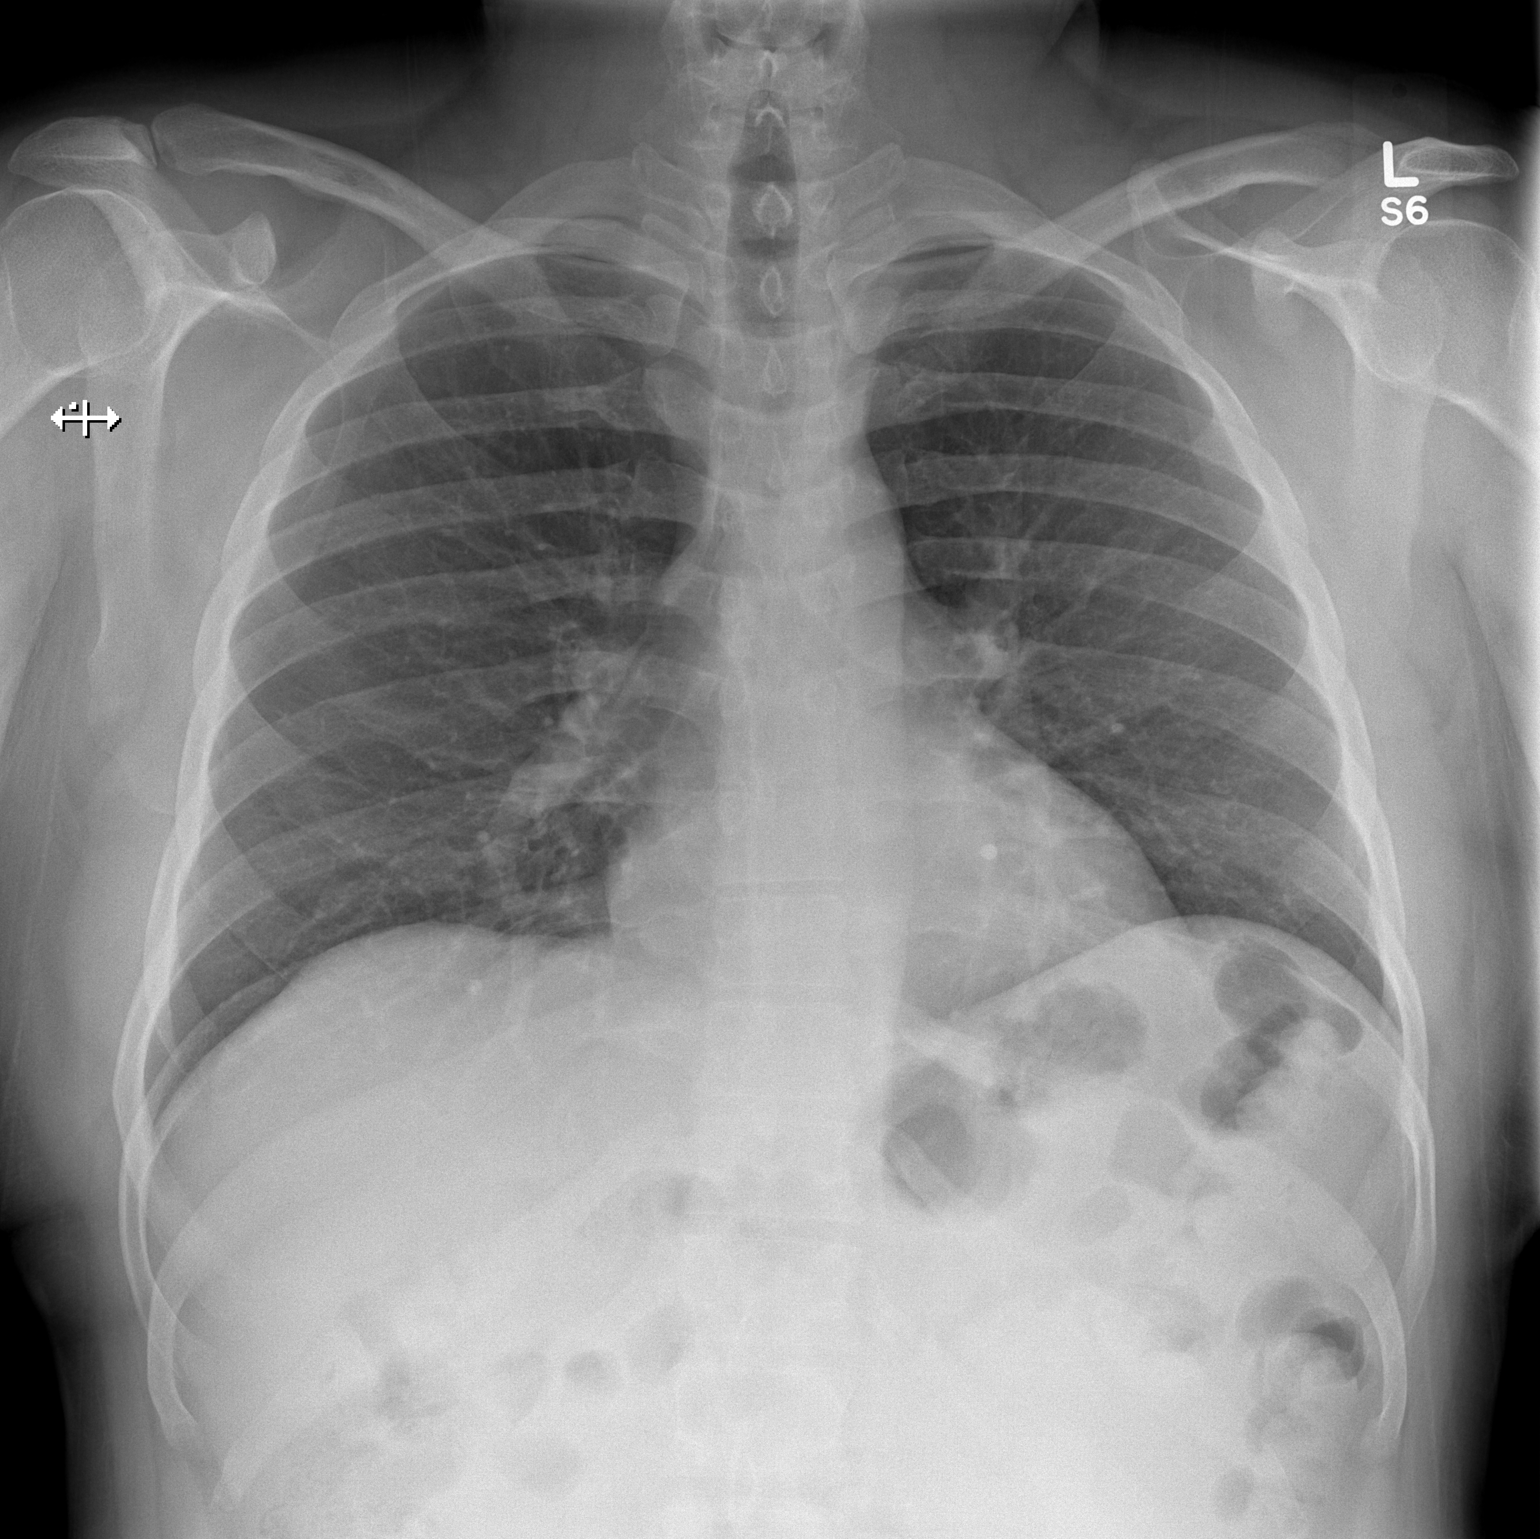

[w chest lat]
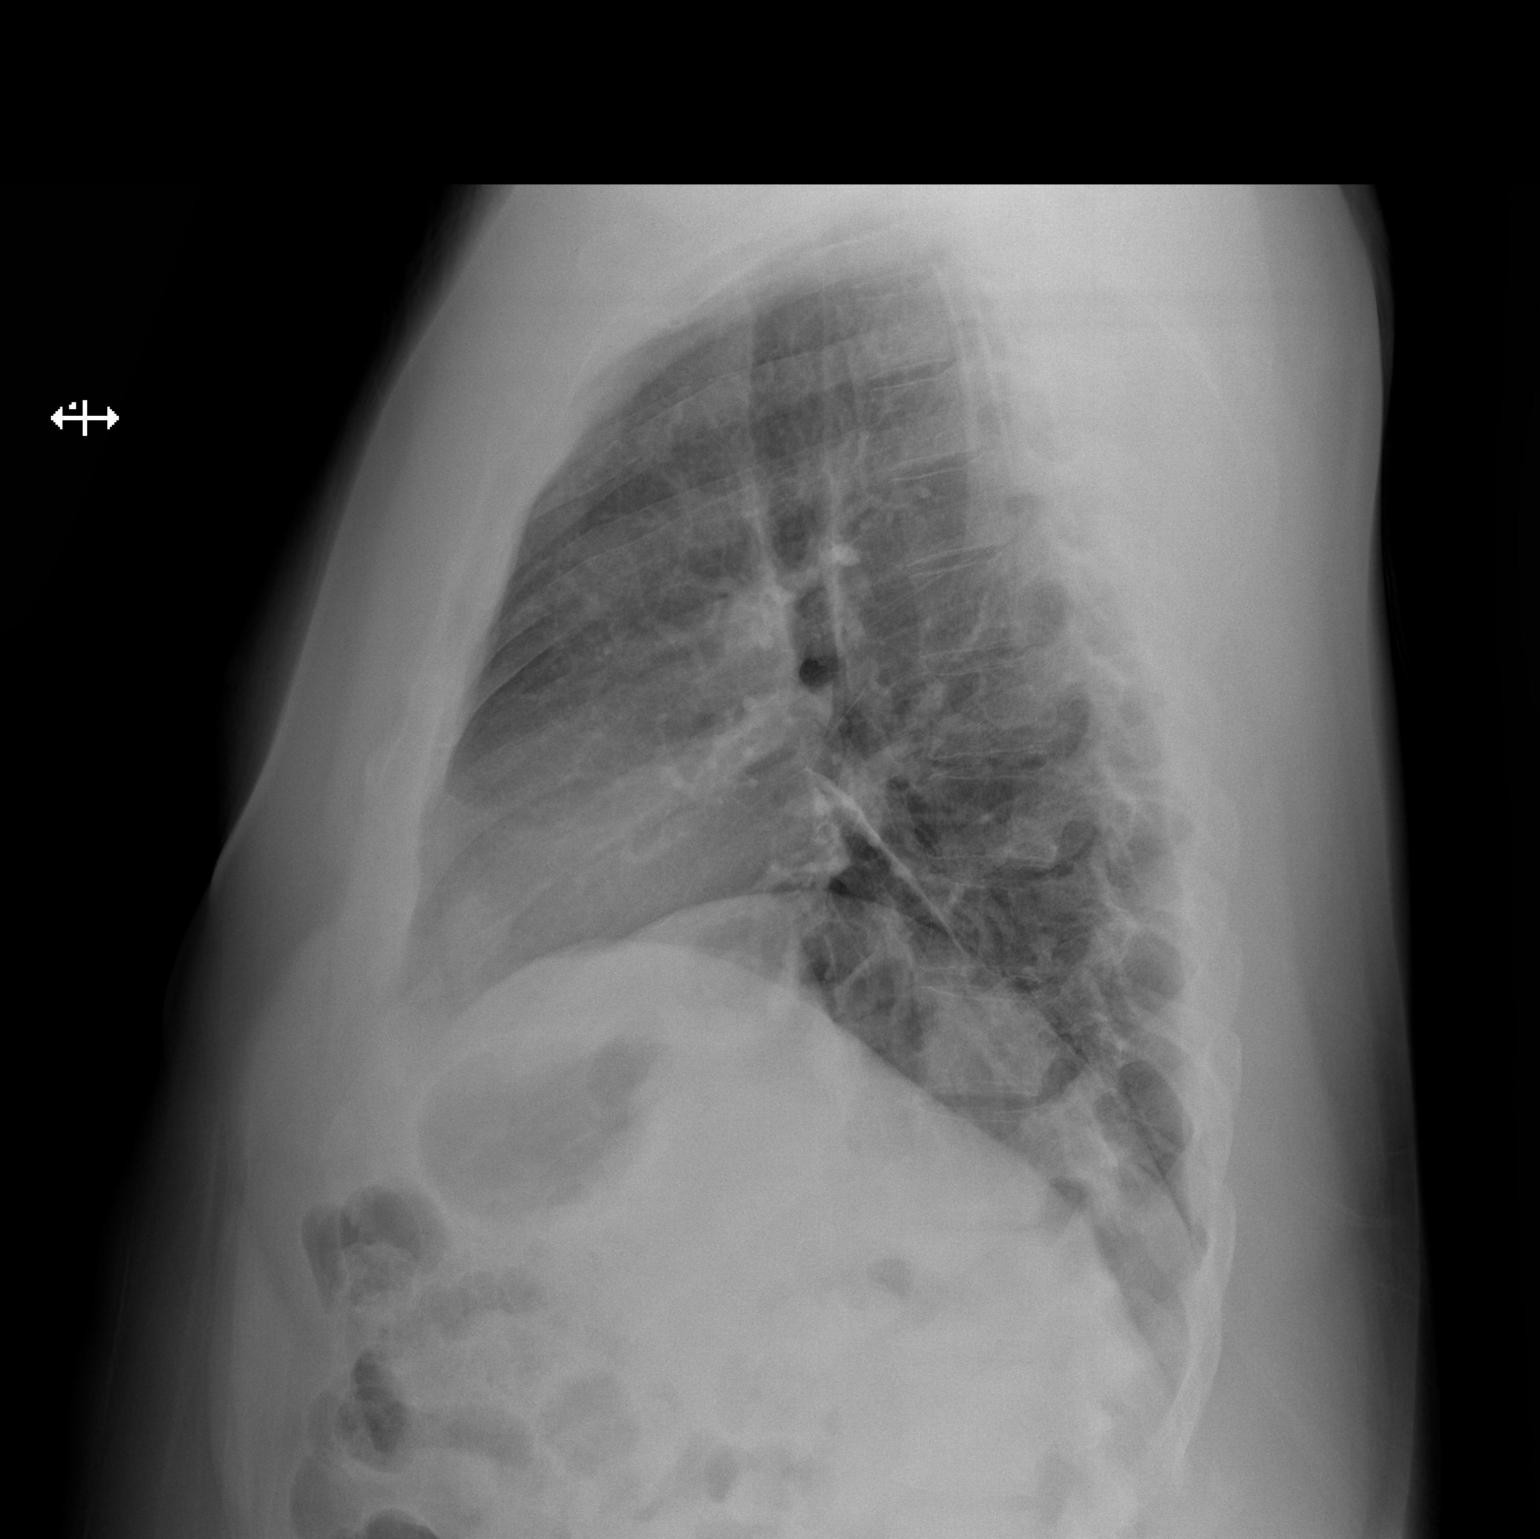

[2 of 2 positions shown; findings below may reference images not displayed]

FINDINGS: The heart size and mediastinal contours are within normal limits.
Both lungs are clear. The visualized skeletal structures are
unremarkable.
IMPRESSION: No active cardiopulmonary disease.

## 2016-01-20 ENCOUNTER — Encounter: Payer: Self-pay | Admitting: Physical Medicine & Rehabilitation

## 2016-03-13 ENCOUNTER — Ambulatory Visit: Payer: Medicare Other | Admitting: Family Medicine

## 2016-03-17 ENCOUNTER — Ambulatory Visit: Payer: Medicare Other | Admitting: Family Medicine

## 2016-04-08 DIAGNOSIS — M25561 Pain in right knee: Secondary | ICD-10-CM | POA: Diagnosis not present

## 2016-04-08 DIAGNOSIS — M25562 Pain in left knee: Secondary | ICD-10-CM | POA: Diagnosis not present

## 2016-04-08 DIAGNOSIS — M542 Cervicalgia: Secondary | ICD-10-CM | POA: Diagnosis not present

## 2016-04-08 DIAGNOSIS — Z79891 Long term (current) use of opiate analgesic: Secondary | ICD-10-CM | POA: Diagnosis not present

## 2016-04-08 DIAGNOSIS — G8929 Other chronic pain: Secondary | ICD-10-CM | POA: Diagnosis not present

## 2016-04-08 DIAGNOSIS — G894 Chronic pain syndrome: Secondary | ICD-10-CM | POA: Diagnosis not present

## 2016-05-08 DIAGNOSIS — G8929 Other chronic pain: Secondary | ICD-10-CM | POA: Diagnosis not present

## 2016-05-08 DIAGNOSIS — M25562 Pain in left knee: Secondary | ICD-10-CM | POA: Diagnosis not present

## 2016-05-08 DIAGNOSIS — Z79891 Long term (current) use of opiate analgesic: Secondary | ICD-10-CM | POA: Diagnosis not present

## 2016-05-08 DIAGNOSIS — M25561 Pain in right knee: Secondary | ICD-10-CM | POA: Diagnosis not present

## 2016-05-08 DIAGNOSIS — M5033 Other cervical disc degeneration, cervicothoracic region: Secondary | ICD-10-CM | POA: Diagnosis not present

## 2016-05-08 DIAGNOSIS — G894 Chronic pain syndrome: Secondary | ICD-10-CM | POA: Diagnosis not present

## 2016-06-08 DIAGNOSIS — M545 Low back pain: Secondary | ICD-10-CM | POA: Diagnosis not present

## 2016-06-08 DIAGNOSIS — M542 Cervicalgia: Secondary | ICD-10-CM | POA: Diagnosis not present

## 2016-06-08 DIAGNOSIS — M25512 Pain in left shoulder: Secondary | ICD-10-CM | POA: Diagnosis not present

## 2016-06-08 DIAGNOSIS — M79662 Pain in left lower leg: Secondary | ICD-10-CM | POA: Diagnosis not present

## 2016-06-08 DIAGNOSIS — Z79891 Long term (current) use of opiate analgesic: Secondary | ICD-10-CM | POA: Diagnosis not present

## 2016-06-08 DIAGNOSIS — G894 Chronic pain syndrome: Secondary | ICD-10-CM | POA: Diagnosis not present

## 2016-07-09 DIAGNOSIS — G894 Chronic pain syndrome: Secondary | ICD-10-CM | POA: Diagnosis not present

## 2016-07-09 DIAGNOSIS — M25562 Pain in left knee: Secondary | ICD-10-CM | POA: Diagnosis not present

## 2016-07-09 DIAGNOSIS — M25561 Pain in right knee: Secondary | ICD-10-CM | POA: Diagnosis not present

## 2016-07-09 DIAGNOSIS — Z79891 Long term (current) use of opiate analgesic: Secondary | ICD-10-CM | POA: Diagnosis not present

## 2016-07-09 DIAGNOSIS — G541 Lumbosacral plexus disorders: Secondary | ICD-10-CM | POA: Diagnosis not present

## 2016-08-10 DIAGNOSIS — M5412 Radiculopathy, cervical region: Secondary | ICD-10-CM | POA: Diagnosis not present

## 2016-08-10 DIAGNOSIS — M79622 Pain in left upper arm: Secondary | ICD-10-CM | POA: Diagnosis not present

## 2016-08-10 DIAGNOSIS — M25512 Pain in left shoulder: Secondary | ICD-10-CM | POA: Diagnosis not present

## 2016-08-10 DIAGNOSIS — Z79891 Long term (current) use of opiate analgesic: Secondary | ICD-10-CM | POA: Diagnosis not present

## 2016-08-10 DIAGNOSIS — M79602 Pain in left arm: Secondary | ICD-10-CM | POA: Diagnosis not present

## 2016-08-10 DIAGNOSIS — G894 Chronic pain syndrome: Secondary | ICD-10-CM | POA: Diagnosis not present

## 2016-09-10 DIAGNOSIS — M79602 Pain in left arm: Secondary | ICD-10-CM | POA: Diagnosis not present

## 2016-09-10 DIAGNOSIS — Z79891 Long term (current) use of opiate analgesic: Secondary | ICD-10-CM | POA: Diagnosis not present

## 2016-09-10 DIAGNOSIS — M5412 Radiculopathy, cervical region: Secondary | ICD-10-CM | POA: Diagnosis not present

## 2016-09-10 DIAGNOSIS — G894 Chronic pain syndrome: Secondary | ICD-10-CM | POA: Diagnosis not present

## 2016-09-10 DIAGNOSIS — M79622 Pain in left upper arm: Secondary | ICD-10-CM | POA: Diagnosis not present

## 2016-09-10 DIAGNOSIS — M25562 Pain in left knee: Secondary | ICD-10-CM | POA: Diagnosis not present

## 2016-10-13 DIAGNOSIS — M25512 Pain in left shoulder: Secondary | ICD-10-CM | POA: Diagnosis not present

## 2016-10-13 DIAGNOSIS — M545 Low back pain: Secondary | ICD-10-CM | POA: Diagnosis not present

## 2016-10-13 DIAGNOSIS — G894 Chronic pain syndrome: Secondary | ICD-10-CM | POA: Diagnosis not present

## 2016-10-13 DIAGNOSIS — M79622 Pain in left upper arm: Secondary | ICD-10-CM | POA: Diagnosis not present

## 2016-10-13 DIAGNOSIS — M542 Cervicalgia: Secondary | ICD-10-CM | POA: Diagnosis not present

## 2016-10-13 DIAGNOSIS — Z79891 Long term (current) use of opiate analgesic: Secondary | ICD-10-CM | POA: Diagnosis not present

## 2016-11-13 DIAGNOSIS — M79662 Pain in left lower leg: Secondary | ICD-10-CM | POA: Diagnosis not present

## 2016-11-13 DIAGNOSIS — G894 Chronic pain syndrome: Secondary | ICD-10-CM | POA: Diagnosis not present

## 2016-11-13 DIAGNOSIS — Z79891 Long term (current) use of opiate analgesic: Secondary | ICD-10-CM | POA: Diagnosis not present

## 2016-11-13 DIAGNOSIS — M25512 Pain in left shoulder: Secondary | ICD-10-CM | POA: Diagnosis not present

## 2016-11-13 DIAGNOSIS — M542 Cervicalgia: Secondary | ICD-10-CM | POA: Diagnosis not present

## 2016-11-13 DIAGNOSIS — M545 Low back pain: Secondary | ICD-10-CM | POA: Diagnosis not present

## 2016-12-14 DIAGNOSIS — M542 Cervicalgia: Secondary | ICD-10-CM | POA: Diagnosis not present

## 2016-12-14 DIAGNOSIS — Z79891 Long term (current) use of opiate analgesic: Secondary | ICD-10-CM | POA: Diagnosis not present

## 2016-12-14 DIAGNOSIS — G894 Chronic pain syndrome: Secondary | ICD-10-CM | POA: Diagnosis not present

## 2016-12-14 DIAGNOSIS — M545 Low back pain: Secondary | ICD-10-CM | POA: Diagnosis not present

## 2016-12-14 DIAGNOSIS — M79602 Pain in left arm: Secondary | ICD-10-CM | POA: Diagnosis not present

## 2016-12-14 DIAGNOSIS — M25512 Pain in left shoulder: Secondary | ICD-10-CM | POA: Diagnosis not present

## 2017-01-14 DIAGNOSIS — M25562 Pain in left knee: Secondary | ICD-10-CM | POA: Diagnosis not present

## 2017-01-14 DIAGNOSIS — M79602 Pain in left arm: Secondary | ICD-10-CM | POA: Diagnosis not present

## 2017-01-14 DIAGNOSIS — M5412 Radiculopathy, cervical region: Secondary | ICD-10-CM | POA: Diagnosis not present

## 2017-01-14 DIAGNOSIS — M79622 Pain in left upper arm: Secondary | ICD-10-CM | POA: Diagnosis not present

## 2017-01-14 DIAGNOSIS — G894 Chronic pain syndrome: Secondary | ICD-10-CM | POA: Diagnosis not present

## 2017-02-15 DIAGNOSIS — M79622 Pain in left upper arm: Secondary | ICD-10-CM | POA: Diagnosis not present

## 2017-02-15 DIAGNOSIS — G894 Chronic pain syndrome: Secondary | ICD-10-CM | POA: Diagnosis not present

## 2017-02-15 DIAGNOSIS — M5412 Radiculopathy, cervical region: Secondary | ICD-10-CM | POA: Diagnosis not present

## 2017-02-15 DIAGNOSIS — M545 Low back pain: Secondary | ICD-10-CM | POA: Diagnosis not present

## 2017-02-15 DIAGNOSIS — M25512 Pain in left shoulder: Secondary | ICD-10-CM | POA: Diagnosis not present

## 2017-03-18 DIAGNOSIS — M25512 Pain in left shoulder: Secondary | ICD-10-CM | POA: Diagnosis not present

## 2017-03-18 DIAGNOSIS — M545 Low back pain: Secondary | ICD-10-CM | POA: Diagnosis not present

## 2017-03-18 DIAGNOSIS — M79602 Pain in left arm: Secondary | ICD-10-CM | POA: Diagnosis not present

## 2017-03-18 DIAGNOSIS — G894 Chronic pain syndrome: Secondary | ICD-10-CM | POA: Diagnosis not present

## 2017-03-18 DIAGNOSIS — M542 Cervicalgia: Secondary | ICD-10-CM | POA: Diagnosis not present

## 2017-04-19 DIAGNOSIS — G894 Chronic pain syndrome: Secondary | ICD-10-CM | POA: Diagnosis not present

## 2017-04-19 DIAGNOSIS — M79662 Pain in left lower leg: Secondary | ICD-10-CM | POA: Diagnosis not present

## 2017-04-19 DIAGNOSIS — M25512 Pain in left shoulder: Secondary | ICD-10-CM | POA: Diagnosis not present

## 2017-04-19 DIAGNOSIS — M542 Cervicalgia: Secondary | ICD-10-CM | POA: Diagnosis not present

## 2017-04-19 DIAGNOSIS — M545 Low back pain: Secondary | ICD-10-CM | POA: Diagnosis not present

## 2017-05-24 DIAGNOSIS — M542 Cervicalgia: Secondary | ICD-10-CM | POA: Diagnosis not present

## 2017-05-24 DIAGNOSIS — G894 Chronic pain syndrome: Secondary | ICD-10-CM | POA: Diagnosis not present

## 2017-05-24 DIAGNOSIS — M25512 Pain in left shoulder: Secondary | ICD-10-CM | POA: Diagnosis not present

## 2017-05-24 DIAGNOSIS — M79662 Pain in left lower leg: Secondary | ICD-10-CM | POA: Diagnosis not present

## 2017-05-24 DIAGNOSIS — M545 Low back pain: Secondary | ICD-10-CM | POA: Diagnosis not present

## 2017-06-24 DIAGNOSIS — M545 Low back pain: Secondary | ICD-10-CM | POA: Diagnosis not present

## 2017-06-24 DIAGNOSIS — G894 Chronic pain syndrome: Secondary | ICD-10-CM | POA: Diagnosis not present

## 2017-06-24 DIAGNOSIS — M25512 Pain in left shoulder: Secondary | ICD-10-CM | POA: Diagnosis not present

## 2017-06-24 DIAGNOSIS — M79662 Pain in left lower leg: Secondary | ICD-10-CM | POA: Diagnosis not present

## 2017-06-24 DIAGNOSIS — M542 Cervicalgia: Secondary | ICD-10-CM | POA: Diagnosis not present

## 2017-07-26 DIAGNOSIS — G894 Chronic pain syndrome: Secondary | ICD-10-CM | POA: Diagnosis not present

## 2017-07-26 DIAGNOSIS — M542 Cervicalgia: Secondary | ICD-10-CM | POA: Diagnosis not present

## 2017-07-26 DIAGNOSIS — M545 Low back pain: Secondary | ICD-10-CM | POA: Diagnosis not present

## 2017-07-26 DIAGNOSIS — M25512 Pain in left shoulder: Secondary | ICD-10-CM | POA: Diagnosis not present

## 2017-07-26 DIAGNOSIS — M79662 Pain in left lower leg: Secondary | ICD-10-CM | POA: Diagnosis not present

## 2017-08-26 DIAGNOSIS — G8929 Other chronic pain: Secondary | ICD-10-CM | POA: Diagnosis not present

## 2017-08-26 DIAGNOSIS — M545 Low back pain: Secondary | ICD-10-CM | POA: Diagnosis not present

## 2017-08-26 DIAGNOSIS — M79662 Pain in left lower leg: Secondary | ICD-10-CM | POA: Diagnosis not present

## 2017-08-26 DIAGNOSIS — Z79891 Long term (current) use of opiate analgesic: Secondary | ICD-10-CM | POA: Diagnosis not present

## 2017-08-26 DIAGNOSIS — M542 Cervicalgia: Secondary | ICD-10-CM | POA: Diagnosis not present

## 2017-08-26 DIAGNOSIS — G894 Chronic pain syndrome: Secondary | ICD-10-CM | POA: Diagnosis not present

## 2017-08-26 DIAGNOSIS — M79605 Pain in left leg: Secondary | ICD-10-CM | POA: Diagnosis not present

## 2017-09-30 DIAGNOSIS — M545 Low back pain: Secondary | ICD-10-CM | POA: Diagnosis not present

## 2017-09-30 DIAGNOSIS — M542 Cervicalgia: Secondary | ICD-10-CM | POA: Diagnosis not present

## 2017-09-30 DIAGNOSIS — Z79891 Long term (current) use of opiate analgesic: Secondary | ICD-10-CM | POA: Diagnosis not present

## 2017-09-30 DIAGNOSIS — G8929 Other chronic pain: Secondary | ICD-10-CM | POA: Diagnosis not present

## 2017-09-30 DIAGNOSIS — M25512 Pain in left shoulder: Secondary | ICD-10-CM | POA: Diagnosis not present

## 2017-09-30 DIAGNOSIS — G894 Chronic pain syndrome: Secondary | ICD-10-CM | POA: Diagnosis not present

## 2017-09-30 DIAGNOSIS — M79662 Pain in left lower leg: Secondary | ICD-10-CM | POA: Diagnosis not present

## 2017-10-29 DIAGNOSIS — Z79891 Long term (current) use of opiate analgesic: Secondary | ICD-10-CM | POA: Diagnosis not present

## 2017-10-29 DIAGNOSIS — G8929 Other chronic pain: Secondary | ICD-10-CM | POA: Diagnosis not present

## 2017-10-29 DIAGNOSIS — G894 Chronic pain syndrome: Secondary | ICD-10-CM | POA: Diagnosis not present

## 2017-10-29 DIAGNOSIS — M542 Cervicalgia: Secondary | ICD-10-CM | POA: Diagnosis not present

## 2017-10-29 DIAGNOSIS — M5431 Sciatica, right side: Secondary | ICD-10-CM | POA: Diagnosis not present

## 2017-10-29 DIAGNOSIS — M5412 Radiculopathy, cervical region: Secondary | ICD-10-CM | POA: Diagnosis not present

## 2017-12-01 DIAGNOSIS — Z79891 Long term (current) use of opiate analgesic: Secondary | ICD-10-CM | POA: Diagnosis not present

## 2017-12-01 DIAGNOSIS — M542 Cervicalgia: Secondary | ICD-10-CM | POA: Diagnosis not present

## 2017-12-01 DIAGNOSIS — M25512 Pain in left shoulder: Secondary | ICD-10-CM | POA: Diagnosis not present

## 2017-12-01 DIAGNOSIS — G894 Chronic pain syndrome: Secondary | ICD-10-CM | POA: Diagnosis not present

## 2017-12-01 DIAGNOSIS — G8929 Other chronic pain: Secondary | ICD-10-CM | POA: Diagnosis not present

## 2017-12-01 DIAGNOSIS — M545 Low back pain: Secondary | ICD-10-CM | POA: Diagnosis not present

## 2017-12-01 DIAGNOSIS — M79622 Pain in left upper arm: Secondary | ICD-10-CM | POA: Diagnosis not present

## 2017-12-31 DIAGNOSIS — M79662 Pain in left lower leg: Secondary | ICD-10-CM | POA: Diagnosis not present

## 2017-12-31 DIAGNOSIS — M542 Cervicalgia: Secondary | ICD-10-CM | POA: Diagnosis not present

## 2017-12-31 DIAGNOSIS — G8929 Other chronic pain: Secondary | ICD-10-CM | POA: Diagnosis not present

## 2017-12-31 DIAGNOSIS — G894 Chronic pain syndrome: Secondary | ICD-10-CM | POA: Diagnosis not present

## 2017-12-31 DIAGNOSIS — M79622 Pain in left upper arm: Secondary | ICD-10-CM | POA: Diagnosis not present

## 2017-12-31 DIAGNOSIS — M79605 Pain in left leg: Secondary | ICD-10-CM | POA: Diagnosis not present

## 2017-12-31 DIAGNOSIS — Z79891 Long term (current) use of opiate analgesic: Secondary | ICD-10-CM | POA: Diagnosis not present

## 2018-02-02 DIAGNOSIS — M545 Low back pain: Secondary | ICD-10-CM | POA: Diagnosis not present

## 2018-02-02 DIAGNOSIS — M79622 Pain in left upper arm: Secondary | ICD-10-CM | POA: Diagnosis not present

## 2018-02-02 DIAGNOSIS — Z79891 Long term (current) use of opiate analgesic: Secondary | ICD-10-CM | POA: Diagnosis not present

## 2018-02-02 DIAGNOSIS — M542 Cervicalgia: Secondary | ICD-10-CM | POA: Diagnosis not present

## 2018-02-02 DIAGNOSIS — M79605 Pain in left leg: Secondary | ICD-10-CM | POA: Diagnosis not present

## 2018-02-02 DIAGNOSIS — G8929 Other chronic pain: Secondary | ICD-10-CM | POA: Diagnosis not present

## 2018-02-02 DIAGNOSIS — G894 Chronic pain syndrome: Secondary | ICD-10-CM | POA: Diagnosis not present

## 2018-03-04 DIAGNOSIS — G8929 Other chronic pain: Secondary | ICD-10-CM | POA: Diagnosis not present

## 2018-03-04 DIAGNOSIS — M79622 Pain in left upper arm: Secondary | ICD-10-CM | POA: Diagnosis not present

## 2018-03-04 DIAGNOSIS — M79605 Pain in left leg: Secondary | ICD-10-CM | POA: Diagnosis not present

## 2018-03-04 DIAGNOSIS — Z79891 Long term (current) use of opiate analgesic: Secondary | ICD-10-CM | POA: Diagnosis not present

## 2018-03-04 DIAGNOSIS — M79662 Pain in left lower leg: Secondary | ICD-10-CM | POA: Diagnosis not present

## 2018-03-04 DIAGNOSIS — M545 Low back pain: Secondary | ICD-10-CM | POA: Diagnosis not present

## 2018-03-04 DIAGNOSIS — G894 Chronic pain syndrome: Secondary | ICD-10-CM | POA: Diagnosis not present

## 2018-04-04 DIAGNOSIS — M79662 Pain in left lower leg: Secondary | ICD-10-CM | POA: Diagnosis not present

## 2018-04-04 DIAGNOSIS — M79622 Pain in left upper arm: Secondary | ICD-10-CM | POA: Diagnosis not present

## 2018-04-04 DIAGNOSIS — G894 Chronic pain syndrome: Secondary | ICD-10-CM | POA: Diagnosis not present

## 2018-04-04 DIAGNOSIS — G8929 Other chronic pain: Secondary | ICD-10-CM | POA: Diagnosis not present

## 2018-04-04 DIAGNOSIS — M79605 Pain in left leg: Secondary | ICD-10-CM | POA: Diagnosis not present

## 2018-04-04 DIAGNOSIS — Z79891 Long term (current) use of opiate analgesic: Secondary | ICD-10-CM | POA: Diagnosis not present

## 2018-04-04 DIAGNOSIS — M545 Low back pain: Secondary | ICD-10-CM | POA: Diagnosis not present

## 2018-05-04 DIAGNOSIS — G894 Chronic pain syndrome: Secondary | ICD-10-CM | POA: Diagnosis not present

## 2018-05-04 DIAGNOSIS — M79622 Pain in left upper arm: Secondary | ICD-10-CM | POA: Diagnosis not present

## 2018-05-04 DIAGNOSIS — M79605 Pain in left leg: Secondary | ICD-10-CM | POA: Diagnosis not present

## 2018-05-04 DIAGNOSIS — G8929 Other chronic pain: Secondary | ICD-10-CM | POA: Diagnosis not present

## 2018-05-04 DIAGNOSIS — Z79891 Long term (current) use of opiate analgesic: Secondary | ICD-10-CM | POA: Diagnosis not present

## 2018-05-04 DIAGNOSIS — M79662 Pain in left lower leg: Secondary | ICD-10-CM | POA: Diagnosis not present

## 2018-05-04 DIAGNOSIS — M545 Low back pain: Secondary | ICD-10-CM | POA: Diagnosis not present

## 2018-06-03 DIAGNOSIS — M79662 Pain in left lower leg: Secondary | ICD-10-CM | POA: Diagnosis not present

## 2018-06-03 DIAGNOSIS — G894 Chronic pain syndrome: Secondary | ICD-10-CM | POA: Diagnosis not present

## 2018-06-03 DIAGNOSIS — G8929 Other chronic pain: Secondary | ICD-10-CM | POA: Diagnosis not present

## 2018-06-03 DIAGNOSIS — Z79891 Long term (current) use of opiate analgesic: Secondary | ICD-10-CM | POA: Diagnosis not present

## 2018-06-03 DIAGNOSIS — M79605 Pain in left leg: Secondary | ICD-10-CM | POA: Diagnosis not present

## 2018-06-03 DIAGNOSIS — M79622 Pain in left upper arm: Secondary | ICD-10-CM | POA: Diagnosis not present

## 2018-06-03 DIAGNOSIS — M545 Low back pain: Secondary | ICD-10-CM | POA: Diagnosis not present

## 2018-07-04 DIAGNOSIS — Z79891 Long term (current) use of opiate analgesic: Secondary | ICD-10-CM | POA: Diagnosis not present

## 2018-07-04 DIAGNOSIS — G894 Chronic pain syndrome: Secondary | ICD-10-CM | POA: Diagnosis not present

## 2018-07-04 DIAGNOSIS — M79605 Pain in left leg: Secondary | ICD-10-CM | POA: Diagnosis not present

## 2018-07-04 DIAGNOSIS — M79662 Pain in left lower leg: Secondary | ICD-10-CM | POA: Diagnosis not present

## 2018-07-04 DIAGNOSIS — M79622 Pain in left upper arm: Secondary | ICD-10-CM | POA: Diagnosis not present

## 2018-07-04 DIAGNOSIS — G8929 Other chronic pain: Secondary | ICD-10-CM | POA: Diagnosis not present

## 2018-07-04 DIAGNOSIS — M545 Low back pain: Secondary | ICD-10-CM | POA: Diagnosis not present

## 2018-08-03 DIAGNOSIS — M545 Low back pain: Secondary | ICD-10-CM | POA: Diagnosis not present

## 2018-08-03 DIAGNOSIS — M79662 Pain in left lower leg: Secondary | ICD-10-CM | POA: Diagnosis not present

## 2018-08-03 DIAGNOSIS — G8929 Other chronic pain: Secondary | ICD-10-CM | POA: Diagnosis not present

## 2018-08-03 DIAGNOSIS — M79605 Pain in left leg: Secondary | ICD-10-CM | POA: Diagnosis not present

## 2018-08-03 DIAGNOSIS — M542 Cervicalgia: Secondary | ICD-10-CM | POA: Diagnosis not present

## 2018-08-03 DIAGNOSIS — Z79891 Long term (current) use of opiate analgesic: Secondary | ICD-10-CM | POA: Diagnosis not present

## 2018-08-03 DIAGNOSIS — G894 Chronic pain syndrome: Secondary | ICD-10-CM | POA: Diagnosis not present

## 2018-09-05 DIAGNOSIS — G894 Chronic pain syndrome: Secondary | ICD-10-CM | POA: Diagnosis not present

## 2018-09-05 DIAGNOSIS — M79662 Pain in left lower leg: Secondary | ICD-10-CM | POA: Diagnosis not present

## 2018-09-05 DIAGNOSIS — G8929 Other chronic pain: Secondary | ICD-10-CM | POA: Diagnosis not present

## 2018-09-05 DIAGNOSIS — M79605 Pain in left leg: Secondary | ICD-10-CM | POA: Diagnosis not present

## 2018-09-05 DIAGNOSIS — M79622 Pain in left upper arm: Secondary | ICD-10-CM | POA: Diagnosis not present

## 2018-09-05 DIAGNOSIS — M545 Low back pain: Secondary | ICD-10-CM | POA: Diagnosis not present

## 2018-09-05 DIAGNOSIS — Z79891 Long term (current) use of opiate analgesic: Secondary | ICD-10-CM | POA: Diagnosis not present

## 2018-10-03 DIAGNOSIS — M545 Low back pain: Secondary | ICD-10-CM | POA: Diagnosis not present

## 2018-10-03 DIAGNOSIS — M79662 Pain in left lower leg: Secondary | ICD-10-CM | POA: Diagnosis not present

## 2018-10-03 DIAGNOSIS — M79622 Pain in left upper arm: Secondary | ICD-10-CM | POA: Diagnosis not present

## 2018-10-03 DIAGNOSIS — G8929 Other chronic pain: Secondary | ICD-10-CM | POA: Diagnosis not present

## 2018-10-03 DIAGNOSIS — G894 Chronic pain syndrome: Secondary | ICD-10-CM | POA: Diagnosis not present

## 2018-10-03 DIAGNOSIS — Z79891 Long term (current) use of opiate analgesic: Secondary | ICD-10-CM | POA: Diagnosis not present

## 2018-10-03 DIAGNOSIS — M79605 Pain in left leg: Secondary | ICD-10-CM | POA: Diagnosis not present

## 2018-11-02 DIAGNOSIS — M79605 Pain in left leg: Secondary | ICD-10-CM | POA: Diagnosis not present

## 2018-11-02 DIAGNOSIS — Z79891 Long term (current) use of opiate analgesic: Secondary | ICD-10-CM | POA: Diagnosis not present

## 2018-11-02 DIAGNOSIS — G8929 Other chronic pain: Secondary | ICD-10-CM | POA: Diagnosis not present

## 2018-11-02 DIAGNOSIS — M79662 Pain in left lower leg: Secondary | ICD-10-CM | POA: Diagnosis not present

## 2018-11-02 DIAGNOSIS — G894 Chronic pain syndrome: Secondary | ICD-10-CM | POA: Diagnosis not present

## 2018-11-02 DIAGNOSIS — M545 Low back pain: Secondary | ICD-10-CM | POA: Diagnosis not present

## 2018-11-02 DIAGNOSIS — M79622 Pain in left upper arm: Secondary | ICD-10-CM | POA: Diagnosis not present

## 2018-12-02 DIAGNOSIS — G894 Chronic pain syndrome: Secondary | ICD-10-CM | POA: Diagnosis not present

## 2018-12-02 DIAGNOSIS — M79605 Pain in left leg: Secondary | ICD-10-CM | POA: Diagnosis not present

## 2018-12-02 DIAGNOSIS — M79622 Pain in left upper arm: Secondary | ICD-10-CM | POA: Diagnosis not present

## 2018-12-02 DIAGNOSIS — M79662 Pain in left lower leg: Secondary | ICD-10-CM | POA: Diagnosis not present

## 2018-12-02 DIAGNOSIS — M545 Low back pain: Secondary | ICD-10-CM | POA: Diagnosis not present

## 2018-12-02 DIAGNOSIS — Z79891 Long term (current) use of opiate analgesic: Secondary | ICD-10-CM | POA: Diagnosis not present

## 2018-12-02 DIAGNOSIS — G8929 Other chronic pain: Secondary | ICD-10-CM | POA: Diagnosis not present

## 2019-01-03 DIAGNOSIS — M79622 Pain in left upper arm: Secondary | ICD-10-CM | POA: Diagnosis not present

## 2019-01-03 DIAGNOSIS — Z79891 Long term (current) use of opiate analgesic: Secondary | ICD-10-CM | POA: Diagnosis not present

## 2019-01-03 DIAGNOSIS — G894 Chronic pain syndrome: Secondary | ICD-10-CM | POA: Diagnosis not present

## 2019-01-03 DIAGNOSIS — M79605 Pain in left leg: Secondary | ICD-10-CM | POA: Diagnosis not present

## 2019-01-03 DIAGNOSIS — M79662 Pain in left lower leg: Secondary | ICD-10-CM | POA: Diagnosis not present

## 2019-01-03 DIAGNOSIS — G8929 Other chronic pain: Secondary | ICD-10-CM | POA: Diagnosis not present

## 2019-01-03 DIAGNOSIS — M545 Low back pain: Secondary | ICD-10-CM | POA: Diagnosis not present

## 2019-01-31 DIAGNOSIS — G8929 Other chronic pain: Secondary | ICD-10-CM | POA: Diagnosis not present

## 2019-01-31 DIAGNOSIS — M79662 Pain in left lower leg: Secondary | ICD-10-CM | POA: Diagnosis not present

## 2019-01-31 DIAGNOSIS — G894 Chronic pain syndrome: Secondary | ICD-10-CM | POA: Diagnosis not present

## 2019-01-31 DIAGNOSIS — Z79891 Long term (current) use of opiate analgesic: Secondary | ICD-10-CM | POA: Diagnosis not present

## 2019-01-31 DIAGNOSIS — M79622 Pain in left upper arm: Secondary | ICD-10-CM | POA: Diagnosis not present

## 2019-01-31 DIAGNOSIS — M545 Low back pain: Secondary | ICD-10-CM | POA: Diagnosis not present

## 2019-01-31 DIAGNOSIS — M79605 Pain in left leg: Secondary | ICD-10-CM | POA: Diagnosis not present

## 2019-02-28 DIAGNOSIS — M79622 Pain in left upper arm: Secondary | ICD-10-CM | POA: Diagnosis not present

## 2019-02-28 DIAGNOSIS — M79605 Pain in left leg: Secondary | ICD-10-CM | POA: Diagnosis not present

## 2019-02-28 DIAGNOSIS — M542 Cervicalgia: Secondary | ICD-10-CM | POA: Diagnosis not present

## 2019-02-28 DIAGNOSIS — M545 Low back pain: Secondary | ICD-10-CM | POA: Diagnosis not present

## 2019-02-28 DIAGNOSIS — G894 Chronic pain syndrome: Secondary | ICD-10-CM | POA: Diagnosis not present

## 2019-02-28 DIAGNOSIS — G8929 Other chronic pain: Secondary | ICD-10-CM | POA: Diagnosis not present

## 2019-02-28 DIAGNOSIS — M79662 Pain in left lower leg: Secondary | ICD-10-CM | POA: Diagnosis not present

## 2019-02-28 DIAGNOSIS — Z79891 Long term (current) use of opiate analgesic: Secondary | ICD-10-CM | POA: Diagnosis not present

## 2019-03-31 DIAGNOSIS — Z79891 Long term (current) use of opiate analgesic: Secondary | ICD-10-CM | POA: Diagnosis not present

## 2019-03-31 DIAGNOSIS — M79605 Pain in left leg: Secondary | ICD-10-CM | POA: Diagnosis not present

## 2019-03-31 DIAGNOSIS — G8929 Other chronic pain: Secondary | ICD-10-CM | POA: Diagnosis not present

## 2019-03-31 DIAGNOSIS — G894 Chronic pain syndrome: Secondary | ICD-10-CM | POA: Diagnosis not present

## 2019-03-31 DIAGNOSIS — M79622 Pain in left upper arm: Secondary | ICD-10-CM | POA: Diagnosis not present

## 2019-03-31 DIAGNOSIS — M545 Low back pain: Secondary | ICD-10-CM | POA: Diagnosis not present

## 2019-03-31 DIAGNOSIS — M542 Cervicalgia: Secondary | ICD-10-CM | POA: Diagnosis not present

## 2019-03-31 DIAGNOSIS — M79662 Pain in left lower leg: Secondary | ICD-10-CM | POA: Diagnosis not present

## 2019-05-03 DIAGNOSIS — Z79891 Long term (current) use of opiate analgesic: Secondary | ICD-10-CM | POA: Diagnosis not present

## 2019-05-03 DIAGNOSIS — M545 Low back pain: Secondary | ICD-10-CM | POA: Diagnosis not present

## 2019-05-03 DIAGNOSIS — M79605 Pain in left leg: Secondary | ICD-10-CM | POA: Diagnosis not present

## 2019-05-03 DIAGNOSIS — M79622 Pain in left upper arm: Secondary | ICD-10-CM | POA: Diagnosis not present

## 2019-05-03 DIAGNOSIS — M79662 Pain in left lower leg: Secondary | ICD-10-CM | POA: Diagnosis not present

## 2019-05-03 DIAGNOSIS — G894 Chronic pain syndrome: Secondary | ICD-10-CM | POA: Diagnosis not present

## 2019-05-03 DIAGNOSIS — M542 Cervicalgia: Secondary | ICD-10-CM | POA: Diagnosis not present

## 2019-05-03 DIAGNOSIS — G8929 Other chronic pain: Secondary | ICD-10-CM | POA: Diagnosis not present

## 2019-05-31 DIAGNOSIS — M79622 Pain in left upper arm: Secondary | ICD-10-CM | POA: Diagnosis not present

## 2019-05-31 DIAGNOSIS — G8929 Other chronic pain: Secondary | ICD-10-CM | POA: Diagnosis not present

## 2019-05-31 DIAGNOSIS — M79605 Pain in left leg: Secondary | ICD-10-CM | POA: Diagnosis not present

## 2019-05-31 DIAGNOSIS — G894 Chronic pain syndrome: Secondary | ICD-10-CM | POA: Diagnosis not present

## 2019-05-31 DIAGNOSIS — M545 Low back pain: Secondary | ICD-10-CM | POA: Diagnosis not present

## 2019-05-31 DIAGNOSIS — M79662 Pain in left lower leg: Secondary | ICD-10-CM | POA: Diagnosis not present

## 2019-05-31 DIAGNOSIS — M542 Cervicalgia: Secondary | ICD-10-CM | POA: Diagnosis not present

## 2019-05-31 DIAGNOSIS — Z79891 Long term (current) use of opiate analgesic: Secondary | ICD-10-CM | POA: Diagnosis not present

## 2019-07-04 DIAGNOSIS — M79662 Pain in left lower leg: Secondary | ICD-10-CM | POA: Diagnosis not present

## 2019-07-04 DIAGNOSIS — M545 Low back pain: Secondary | ICD-10-CM | POA: Diagnosis not present

## 2019-07-04 DIAGNOSIS — G894 Chronic pain syndrome: Secondary | ICD-10-CM | POA: Diagnosis not present

## 2019-07-04 DIAGNOSIS — G8929 Other chronic pain: Secondary | ICD-10-CM | POA: Diagnosis not present

## 2019-07-04 DIAGNOSIS — M79622 Pain in left upper arm: Secondary | ICD-10-CM | POA: Diagnosis not present

## 2019-07-04 DIAGNOSIS — M79605 Pain in left leg: Secondary | ICD-10-CM | POA: Diagnosis not present

## 2019-07-04 DIAGNOSIS — Z79891 Long term (current) use of opiate analgesic: Secondary | ICD-10-CM | POA: Diagnosis not present

## 2019-07-04 DIAGNOSIS — M542 Cervicalgia: Secondary | ICD-10-CM | POA: Diagnosis not present

## 2019-08-01 DIAGNOSIS — M79662 Pain in left lower leg: Secondary | ICD-10-CM | POA: Diagnosis not present

## 2019-08-01 DIAGNOSIS — M545 Low back pain: Secondary | ICD-10-CM | POA: Diagnosis not present

## 2019-08-01 DIAGNOSIS — M542 Cervicalgia: Secondary | ICD-10-CM | POA: Diagnosis not present

## 2019-08-01 DIAGNOSIS — G8929 Other chronic pain: Secondary | ICD-10-CM | POA: Diagnosis not present

## 2019-08-01 DIAGNOSIS — Z79891 Long term (current) use of opiate analgesic: Secondary | ICD-10-CM | POA: Diagnosis not present

## 2019-08-01 DIAGNOSIS — G894 Chronic pain syndrome: Secondary | ICD-10-CM | POA: Diagnosis not present

## 2019-08-01 DIAGNOSIS — M79605 Pain in left leg: Secondary | ICD-10-CM | POA: Diagnosis not present

## 2019-08-01 DIAGNOSIS — M79622 Pain in left upper arm: Secondary | ICD-10-CM | POA: Diagnosis not present

## 2019-08-29 DIAGNOSIS — M79622 Pain in left upper arm: Secondary | ICD-10-CM | POA: Diagnosis not present

## 2019-08-29 DIAGNOSIS — M545 Low back pain: Secondary | ICD-10-CM | POA: Diagnosis not present

## 2019-08-29 DIAGNOSIS — M542 Cervicalgia: Secondary | ICD-10-CM | POA: Diagnosis not present

## 2019-08-29 DIAGNOSIS — G8929 Other chronic pain: Secondary | ICD-10-CM | POA: Diagnosis not present

## 2019-08-29 DIAGNOSIS — M79662 Pain in left lower leg: Secondary | ICD-10-CM | POA: Diagnosis not present

## 2019-08-29 DIAGNOSIS — M79605 Pain in left leg: Secondary | ICD-10-CM | POA: Diagnosis not present

## 2019-08-29 DIAGNOSIS — G894 Chronic pain syndrome: Secondary | ICD-10-CM | POA: Diagnosis not present

## 2019-08-29 DIAGNOSIS — Z79891 Long term (current) use of opiate analgesic: Secondary | ICD-10-CM | POA: Diagnosis not present

## 2019-09-26 DIAGNOSIS — M79622 Pain in left upper arm: Secondary | ICD-10-CM | POA: Diagnosis not present

## 2019-09-26 DIAGNOSIS — Z79891 Long term (current) use of opiate analgesic: Secondary | ICD-10-CM | POA: Diagnosis not present

## 2019-09-26 DIAGNOSIS — M79662 Pain in left lower leg: Secondary | ICD-10-CM | POA: Diagnosis not present

## 2019-09-26 DIAGNOSIS — G894 Chronic pain syndrome: Secondary | ICD-10-CM | POA: Diagnosis not present

## 2019-09-26 DIAGNOSIS — G8929 Other chronic pain: Secondary | ICD-10-CM | POA: Diagnosis not present

## 2019-09-26 DIAGNOSIS — M542 Cervicalgia: Secondary | ICD-10-CM | POA: Diagnosis not present

## 2019-09-26 DIAGNOSIS — M79605 Pain in left leg: Secondary | ICD-10-CM | POA: Diagnosis not present

## 2019-09-26 DIAGNOSIS — M545 Low back pain: Secondary | ICD-10-CM | POA: Diagnosis not present

## 2019-10-27 DIAGNOSIS — Z79891 Long term (current) use of opiate analgesic: Secondary | ICD-10-CM | POA: Diagnosis not present

## 2019-10-27 DIAGNOSIS — M79622 Pain in left upper arm: Secondary | ICD-10-CM | POA: Diagnosis not present

## 2019-10-27 DIAGNOSIS — M545 Low back pain: Secondary | ICD-10-CM | POA: Diagnosis not present

## 2019-10-27 DIAGNOSIS — M79662 Pain in left lower leg: Secondary | ICD-10-CM | POA: Diagnosis not present

## 2019-10-27 DIAGNOSIS — G8929 Other chronic pain: Secondary | ICD-10-CM | POA: Diagnosis not present

## 2019-10-27 DIAGNOSIS — G894 Chronic pain syndrome: Secondary | ICD-10-CM | POA: Diagnosis not present

## 2019-10-27 DIAGNOSIS — M79605 Pain in left leg: Secondary | ICD-10-CM | POA: Diagnosis not present

## 2019-11-24 DIAGNOSIS — M545 Low back pain: Secondary | ICD-10-CM | POA: Diagnosis not present

## 2019-11-24 DIAGNOSIS — M79622 Pain in left upper arm: Secondary | ICD-10-CM | POA: Diagnosis not present

## 2019-11-24 DIAGNOSIS — M79605 Pain in left leg: Secondary | ICD-10-CM | POA: Diagnosis not present

## 2019-11-24 DIAGNOSIS — G894 Chronic pain syndrome: Secondary | ICD-10-CM | POA: Diagnosis not present

## 2019-11-24 DIAGNOSIS — M79662 Pain in left lower leg: Secondary | ICD-10-CM | POA: Diagnosis not present

## 2019-11-24 DIAGNOSIS — Z79891 Long term (current) use of opiate analgesic: Secondary | ICD-10-CM | POA: Diagnosis not present

## 2019-11-24 DIAGNOSIS — G8929 Other chronic pain: Secondary | ICD-10-CM | POA: Diagnosis not present

## 2019-11-24 DIAGNOSIS — M542 Cervicalgia: Secondary | ICD-10-CM | POA: Diagnosis not present

## 2019-12-22 DIAGNOSIS — M79662 Pain in left lower leg: Secondary | ICD-10-CM | POA: Diagnosis not present

## 2019-12-22 DIAGNOSIS — Z79891 Long term (current) use of opiate analgesic: Secondary | ICD-10-CM | POA: Diagnosis not present

## 2019-12-22 DIAGNOSIS — M542 Cervicalgia: Secondary | ICD-10-CM | POA: Diagnosis not present

## 2019-12-22 DIAGNOSIS — M79622 Pain in left upper arm: Secondary | ICD-10-CM | POA: Diagnosis not present

## 2019-12-22 DIAGNOSIS — M545 Low back pain: Secondary | ICD-10-CM | POA: Diagnosis not present

## 2019-12-22 DIAGNOSIS — G894 Chronic pain syndrome: Secondary | ICD-10-CM | POA: Diagnosis not present

## 2019-12-22 DIAGNOSIS — G8929 Other chronic pain: Secondary | ICD-10-CM | POA: Diagnosis not present

## 2019-12-22 DIAGNOSIS — M79605 Pain in left leg: Secondary | ICD-10-CM | POA: Diagnosis not present

## 2020-01-14 DIAGNOSIS — Z03818 Encounter for observation for suspected exposure to other biological agents ruled out: Secondary | ICD-10-CM | POA: Diagnosis not present

## 2020-01-23 DIAGNOSIS — G8929 Other chronic pain: Secondary | ICD-10-CM | POA: Diagnosis not present

## 2020-01-23 DIAGNOSIS — M79605 Pain in left leg: Secondary | ICD-10-CM | POA: Diagnosis not present

## 2020-01-23 DIAGNOSIS — Z79891 Long term (current) use of opiate analgesic: Secondary | ICD-10-CM | POA: Diagnosis not present

## 2020-01-23 DIAGNOSIS — M542 Cervicalgia: Secondary | ICD-10-CM | POA: Diagnosis not present

## 2020-01-23 DIAGNOSIS — M79662 Pain in left lower leg: Secondary | ICD-10-CM | POA: Diagnosis not present

## 2020-01-23 DIAGNOSIS — G894 Chronic pain syndrome: Secondary | ICD-10-CM | POA: Diagnosis not present

## 2020-01-23 DIAGNOSIS — M79622 Pain in left upper arm: Secondary | ICD-10-CM | POA: Diagnosis not present

## 2020-01-23 DIAGNOSIS — M545 Low back pain: Secondary | ICD-10-CM | POA: Diagnosis not present

## 2020-02-20 DIAGNOSIS — M79662 Pain in left lower leg: Secondary | ICD-10-CM | POA: Diagnosis not present

## 2020-02-20 DIAGNOSIS — M542 Cervicalgia: Secondary | ICD-10-CM | POA: Diagnosis not present

## 2020-02-20 DIAGNOSIS — M545 Low back pain: Secondary | ICD-10-CM | POA: Diagnosis not present

## 2020-02-20 DIAGNOSIS — G894 Chronic pain syndrome: Secondary | ICD-10-CM | POA: Diagnosis not present

## 2020-02-20 DIAGNOSIS — M79605 Pain in left leg: Secondary | ICD-10-CM | POA: Diagnosis not present

## 2020-02-20 DIAGNOSIS — M79622 Pain in left upper arm: Secondary | ICD-10-CM | POA: Diagnosis not present

## 2020-02-20 DIAGNOSIS — G8929 Other chronic pain: Secondary | ICD-10-CM | POA: Diagnosis not present

## 2020-02-20 DIAGNOSIS — Z79891 Long term (current) use of opiate analgesic: Secondary | ICD-10-CM | POA: Diagnosis not present

## 2020-03-19 DIAGNOSIS — G894 Chronic pain syndrome: Secondary | ICD-10-CM | POA: Diagnosis not present

## 2020-03-19 DIAGNOSIS — M542 Cervicalgia: Secondary | ICD-10-CM | POA: Diagnosis not present

## 2020-03-19 DIAGNOSIS — M79662 Pain in left lower leg: Secondary | ICD-10-CM | POA: Diagnosis not present

## 2020-03-19 DIAGNOSIS — G8929 Other chronic pain: Secondary | ICD-10-CM | POA: Diagnosis not present

## 2020-03-19 DIAGNOSIS — M79622 Pain in left upper arm: Secondary | ICD-10-CM | POA: Diagnosis not present

## 2020-03-19 DIAGNOSIS — Z79891 Long term (current) use of opiate analgesic: Secondary | ICD-10-CM | POA: Diagnosis not present

## 2020-03-19 DIAGNOSIS — M79605 Pain in left leg: Secondary | ICD-10-CM | POA: Diagnosis not present

## 2020-03-19 DIAGNOSIS — M545 Low back pain: Secondary | ICD-10-CM | POA: Diagnosis not present

## 2020-04-17 DIAGNOSIS — M79662 Pain in left lower leg: Secondary | ICD-10-CM | POA: Diagnosis not present

## 2020-04-17 DIAGNOSIS — M79622 Pain in left upper arm: Secondary | ICD-10-CM | POA: Diagnosis not present

## 2020-04-17 DIAGNOSIS — M542 Cervicalgia: Secondary | ICD-10-CM | POA: Diagnosis not present

## 2020-04-17 DIAGNOSIS — M545 Low back pain: Secondary | ICD-10-CM | POA: Diagnosis not present

## 2020-04-17 DIAGNOSIS — G894 Chronic pain syndrome: Secondary | ICD-10-CM | POA: Diagnosis not present

## 2020-04-17 DIAGNOSIS — Z79891 Long term (current) use of opiate analgesic: Secondary | ICD-10-CM | POA: Diagnosis not present

## 2020-04-17 DIAGNOSIS — G8929 Other chronic pain: Secondary | ICD-10-CM | POA: Diagnosis not present

## 2020-04-17 DIAGNOSIS — M79605 Pain in left leg: Secondary | ICD-10-CM | POA: Diagnosis not present

## 2020-05-14 DIAGNOSIS — M79622 Pain in left upper arm: Secondary | ICD-10-CM | POA: Diagnosis not present

## 2020-05-14 DIAGNOSIS — G8929 Other chronic pain: Secondary | ICD-10-CM | POA: Diagnosis not present

## 2020-05-14 DIAGNOSIS — G894 Chronic pain syndrome: Secondary | ICD-10-CM | POA: Diagnosis not present

## 2020-05-14 DIAGNOSIS — M542 Cervicalgia: Secondary | ICD-10-CM | POA: Diagnosis not present

## 2020-05-14 DIAGNOSIS — M79605 Pain in left leg: Secondary | ICD-10-CM | POA: Diagnosis not present

## 2020-05-14 DIAGNOSIS — Z79891 Long term (current) use of opiate analgesic: Secondary | ICD-10-CM | POA: Diagnosis not present

## 2020-05-14 DIAGNOSIS — M79662 Pain in left lower leg: Secondary | ICD-10-CM | POA: Diagnosis not present

## 2020-05-14 DIAGNOSIS — M545 Low back pain: Secondary | ICD-10-CM | POA: Diagnosis not present

## 2020-06-11 DIAGNOSIS — M545 Low back pain: Secondary | ICD-10-CM | POA: Diagnosis not present

## 2020-06-11 DIAGNOSIS — Z79891 Long term (current) use of opiate analgesic: Secondary | ICD-10-CM | POA: Diagnosis not present

## 2020-06-11 DIAGNOSIS — M79605 Pain in left leg: Secondary | ICD-10-CM | POA: Diagnosis not present

## 2020-06-11 DIAGNOSIS — M79662 Pain in left lower leg: Secondary | ICD-10-CM | POA: Diagnosis not present

## 2020-06-11 DIAGNOSIS — G894 Chronic pain syndrome: Secondary | ICD-10-CM | POA: Diagnosis not present

## 2020-06-11 DIAGNOSIS — G8929 Other chronic pain: Secondary | ICD-10-CM | POA: Diagnosis not present

## 2020-06-11 DIAGNOSIS — M79622 Pain in left upper arm: Secondary | ICD-10-CM | POA: Diagnosis not present

## 2020-06-11 DIAGNOSIS — M542 Cervicalgia: Secondary | ICD-10-CM | POA: Diagnosis not present

## 2020-07-09 DIAGNOSIS — M79605 Pain in left leg: Secondary | ICD-10-CM | POA: Diagnosis not present

## 2020-07-09 DIAGNOSIS — M79622 Pain in left upper arm: Secondary | ICD-10-CM | POA: Diagnosis not present

## 2020-07-09 DIAGNOSIS — M545 Low back pain: Secondary | ICD-10-CM | POA: Diagnosis not present

## 2020-07-09 DIAGNOSIS — G8929 Other chronic pain: Secondary | ICD-10-CM | POA: Diagnosis not present

## 2020-07-09 DIAGNOSIS — G894 Chronic pain syndrome: Secondary | ICD-10-CM | POA: Diagnosis not present

## 2020-07-09 DIAGNOSIS — Z79891 Long term (current) use of opiate analgesic: Secondary | ICD-10-CM | POA: Diagnosis not present

## 2020-07-09 DIAGNOSIS — M542 Cervicalgia: Secondary | ICD-10-CM | POA: Diagnosis not present

## 2020-07-09 DIAGNOSIS — M79662 Pain in left lower leg: Secondary | ICD-10-CM | POA: Diagnosis not present

## 2020-08-06 DIAGNOSIS — M542 Cervicalgia: Secondary | ICD-10-CM | POA: Diagnosis not present

## 2020-08-06 DIAGNOSIS — G8929 Other chronic pain: Secondary | ICD-10-CM | POA: Diagnosis not present

## 2020-08-06 DIAGNOSIS — M79662 Pain in left lower leg: Secondary | ICD-10-CM | POA: Diagnosis not present

## 2020-08-06 DIAGNOSIS — M79622 Pain in left upper arm: Secondary | ICD-10-CM | POA: Diagnosis not present

## 2020-08-06 DIAGNOSIS — M79605 Pain in left leg: Secondary | ICD-10-CM | POA: Diagnosis not present

## 2020-08-06 DIAGNOSIS — G894 Chronic pain syndrome: Secondary | ICD-10-CM | POA: Diagnosis not present

## 2020-08-06 DIAGNOSIS — M545 Low back pain, unspecified: Secondary | ICD-10-CM | POA: Diagnosis not present

## 2020-08-06 DIAGNOSIS — Z79891 Long term (current) use of opiate analgesic: Secondary | ICD-10-CM | POA: Diagnosis not present

## 2020-09-12 DIAGNOSIS — M542 Cervicalgia: Secondary | ICD-10-CM | POA: Diagnosis not present

## 2020-09-12 DIAGNOSIS — M79662 Pain in left lower leg: Secondary | ICD-10-CM | POA: Diagnosis not present

## 2020-09-12 DIAGNOSIS — M79622 Pain in left upper arm: Secondary | ICD-10-CM | POA: Diagnosis not present

## 2020-09-12 DIAGNOSIS — M545 Low back pain, unspecified: Secondary | ICD-10-CM | POA: Diagnosis not present

## 2020-09-12 DIAGNOSIS — M79605 Pain in left leg: Secondary | ICD-10-CM | POA: Diagnosis not present

## 2020-09-12 DIAGNOSIS — G8929 Other chronic pain: Secondary | ICD-10-CM | POA: Diagnosis not present

## 2020-09-12 DIAGNOSIS — Z79891 Long term (current) use of opiate analgesic: Secondary | ICD-10-CM | POA: Diagnosis not present

## 2020-09-12 DIAGNOSIS — G894 Chronic pain syndrome: Secondary | ICD-10-CM | POA: Diagnosis not present

## 2020-10-15 DIAGNOSIS — Z79891 Long term (current) use of opiate analgesic: Secondary | ICD-10-CM | POA: Diagnosis not present

## 2020-10-15 DIAGNOSIS — G894 Chronic pain syndrome: Secondary | ICD-10-CM | POA: Diagnosis not present

## 2020-10-15 DIAGNOSIS — G8929 Other chronic pain: Secondary | ICD-10-CM | POA: Diagnosis not present

## 2020-10-15 DIAGNOSIS — M542 Cervicalgia: Secondary | ICD-10-CM | POA: Diagnosis not present

## 2020-10-15 DIAGNOSIS — M79605 Pain in left leg: Secondary | ICD-10-CM | POA: Diagnosis not present

## 2020-10-15 DIAGNOSIS — M79662 Pain in left lower leg: Secondary | ICD-10-CM | POA: Diagnosis not present

## 2020-10-15 DIAGNOSIS — M545 Low back pain, unspecified: Secondary | ICD-10-CM | POA: Diagnosis not present

## 2020-10-15 DIAGNOSIS — M79622 Pain in left upper arm: Secondary | ICD-10-CM | POA: Diagnosis not present

## 2020-11-15 DIAGNOSIS — M79662 Pain in left lower leg: Secondary | ICD-10-CM | POA: Diagnosis not present

## 2020-11-15 DIAGNOSIS — M545 Low back pain, unspecified: Secondary | ICD-10-CM | POA: Diagnosis not present

## 2020-11-15 DIAGNOSIS — M542 Cervicalgia: Secondary | ICD-10-CM | POA: Diagnosis not present

## 2020-11-15 DIAGNOSIS — M79661 Pain in right lower leg: Secondary | ICD-10-CM | POA: Diagnosis not present

## 2020-11-15 DIAGNOSIS — Z79891 Long term (current) use of opiate analgesic: Secondary | ICD-10-CM | POA: Diagnosis not present

## 2020-11-15 DIAGNOSIS — M79605 Pain in left leg: Secondary | ICD-10-CM | POA: Diagnosis not present

## 2020-11-15 DIAGNOSIS — G8929 Other chronic pain: Secondary | ICD-10-CM | POA: Diagnosis not present

## 2020-11-15 DIAGNOSIS — G894 Chronic pain syndrome: Secondary | ICD-10-CM | POA: Diagnosis not present

## 2020-12-13 DIAGNOSIS — M79622 Pain in left upper arm: Secondary | ICD-10-CM | POA: Diagnosis not present

## 2020-12-13 DIAGNOSIS — G894 Chronic pain syndrome: Secondary | ICD-10-CM | POA: Diagnosis not present

## 2020-12-13 DIAGNOSIS — M79662 Pain in left lower leg: Secondary | ICD-10-CM | POA: Diagnosis not present

## 2020-12-13 DIAGNOSIS — M545 Low back pain, unspecified: Secondary | ICD-10-CM | POA: Diagnosis not present

## 2020-12-13 DIAGNOSIS — Z79891 Long term (current) use of opiate analgesic: Secondary | ICD-10-CM | POA: Diagnosis not present

## 2020-12-13 DIAGNOSIS — M542 Cervicalgia: Secondary | ICD-10-CM | POA: Diagnosis not present

## 2020-12-13 DIAGNOSIS — G8929 Other chronic pain: Secondary | ICD-10-CM | POA: Diagnosis not present

## 2020-12-13 DIAGNOSIS — M79605 Pain in left leg: Secondary | ICD-10-CM | POA: Diagnosis not present

## 2021-01-17 DIAGNOSIS — G8929 Other chronic pain: Secondary | ICD-10-CM | POA: Diagnosis not present

## 2021-01-17 DIAGNOSIS — M79662 Pain in left lower leg: Secondary | ICD-10-CM | POA: Diagnosis not present

## 2021-01-17 DIAGNOSIS — G894 Chronic pain syndrome: Secondary | ICD-10-CM | POA: Diagnosis not present

## 2021-01-17 DIAGNOSIS — M542 Cervicalgia: Secondary | ICD-10-CM | POA: Diagnosis not present

## 2021-01-17 DIAGNOSIS — M79605 Pain in left leg: Secondary | ICD-10-CM | POA: Diagnosis not present

## 2021-01-17 DIAGNOSIS — M545 Low back pain, unspecified: Secondary | ICD-10-CM | POA: Diagnosis not present

## 2021-01-17 DIAGNOSIS — M79622 Pain in left upper arm: Secondary | ICD-10-CM | POA: Diagnosis not present

## 2021-01-17 DIAGNOSIS — Z79891 Long term (current) use of opiate analgesic: Secondary | ICD-10-CM | POA: Diagnosis not present

## 2021-02-14 DIAGNOSIS — M79605 Pain in left leg: Secondary | ICD-10-CM | POA: Diagnosis not present

## 2021-02-14 DIAGNOSIS — M545 Low back pain, unspecified: Secondary | ICD-10-CM | POA: Diagnosis not present

## 2021-02-14 DIAGNOSIS — G894 Chronic pain syndrome: Secondary | ICD-10-CM | POA: Diagnosis not present

## 2021-02-14 DIAGNOSIS — M542 Cervicalgia: Secondary | ICD-10-CM | POA: Diagnosis not present

## 2021-02-14 DIAGNOSIS — G8929 Other chronic pain: Secondary | ICD-10-CM | POA: Diagnosis not present

## 2021-02-14 DIAGNOSIS — Z79891 Long term (current) use of opiate analgesic: Secondary | ICD-10-CM | POA: Diagnosis not present

## 2021-02-14 DIAGNOSIS — M79622 Pain in left upper arm: Secondary | ICD-10-CM | POA: Diagnosis not present

## 2021-02-14 DIAGNOSIS — M79662 Pain in left lower leg: Secondary | ICD-10-CM | POA: Diagnosis not present

## 2021-03-14 DIAGNOSIS — Z79891 Long term (current) use of opiate analgesic: Secondary | ICD-10-CM | POA: Diagnosis not present

## 2021-03-14 DIAGNOSIS — M79662 Pain in left lower leg: Secondary | ICD-10-CM | POA: Diagnosis not present

## 2021-03-14 DIAGNOSIS — M79605 Pain in left leg: Secondary | ICD-10-CM | POA: Diagnosis not present

## 2021-03-14 DIAGNOSIS — M545 Low back pain, unspecified: Secondary | ICD-10-CM | POA: Diagnosis not present

## 2021-03-14 DIAGNOSIS — G894 Chronic pain syndrome: Secondary | ICD-10-CM | POA: Diagnosis not present

## 2021-03-14 DIAGNOSIS — G8929 Other chronic pain: Secondary | ICD-10-CM | POA: Diagnosis not present

## 2021-03-14 DIAGNOSIS — M542 Cervicalgia: Secondary | ICD-10-CM | POA: Diagnosis not present

## 2021-03-14 DIAGNOSIS — M79622 Pain in left upper arm: Secondary | ICD-10-CM | POA: Diagnosis not present

## 2021-04-18 DIAGNOSIS — M542 Cervicalgia: Secondary | ICD-10-CM | POA: Diagnosis not present

## 2021-04-18 DIAGNOSIS — G894 Chronic pain syndrome: Secondary | ICD-10-CM | POA: Diagnosis not present

## 2021-04-18 DIAGNOSIS — M79662 Pain in left lower leg: Secondary | ICD-10-CM | POA: Diagnosis not present

## 2021-04-18 DIAGNOSIS — M545 Low back pain, unspecified: Secondary | ICD-10-CM | POA: Diagnosis not present

## 2021-04-18 DIAGNOSIS — M79622 Pain in left upper arm: Secondary | ICD-10-CM | POA: Diagnosis not present

## 2021-04-18 DIAGNOSIS — M79605 Pain in left leg: Secondary | ICD-10-CM | POA: Diagnosis not present

## 2021-04-18 DIAGNOSIS — Z79891 Long term (current) use of opiate analgesic: Secondary | ICD-10-CM | POA: Diagnosis not present

## 2021-04-18 DIAGNOSIS — G8929 Other chronic pain: Secondary | ICD-10-CM | POA: Diagnosis not present

## 2021-05-19 DIAGNOSIS — Z79891 Long term (current) use of opiate analgesic: Secondary | ICD-10-CM | POA: Diagnosis not present

## 2021-05-19 DIAGNOSIS — M545 Low back pain, unspecified: Secondary | ICD-10-CM | POA: Diagnosis not present

## 2021-05-19 DIAGNOSIS — M79605 Pain in left leg: Secondary | ICD-10-CM | POA: Diagnosis not present

## 2021-05-19 DIAGNOSIS — M79662 Pain in left lower leg: Secondary | ICD-10-CM | POA: Diagnosis not present

## 2021-05-19 DIAGNOSIS — G894 Chronic pain syndrome: Secondary | ICD-10-CM | POA: Diagnosis not present

## 2021-05-19 DIAGNOSIS — M79622 Pain in left upper arm: Secondary | ICD-10-CM | POA: Diagnosis not present

## 2021-05-19 DIAGNOSIS — M542 Cervicalgia: Secondary | ICD-10-CM | POA: Diagnosis not present

## 2021-06-19 DIAGNOSIS — M79662 Pain in left lower leg: Secondary | ICD-10-CM | POA: Diagnosis not present

## 2021-06-19 DIAGNOSIS — G894 Chronic pain syndrome: Secondary | ICD-10-CM | POA: Diagnosis not present

## 2021-06-19 DIAGNOSIS — M79605 Pain in left leg: Secondary | ICD-10-CM | POA: Diagnosis not present

## 2021-06-19 DIAGNOSIS — M79622 Pain in left upper arm: Secondary | ICD-10-CM | POA: Diagnosis not present

## 2021-06-19 DIAGNOSIS — Z79891 Long term (current) use of opiate analgesic: Secondary | ICD-10-CM | POA: Diagnosis not present

## 2021-06-19 DIAGNOSIS — M545 Low back pain, unspecified: Secondary | ICD-10-CM | POA: Diagnosis not present

## 2021-06-19 DIAGNOSIS — M542 Cervicalgia: Secondary | ICD-10-CM | POA: Diagnosis not present

## 2021-07-17 DIAGNOSIS — M79662 Pain in left lower leg: Secondary | ICD-10-CM | POA: Diagnosis not present

## 2021-07-17 DIAGNOSIS — M542 Cervicalgia: Secondary | ICD-10-CM | POA: Diagnosis not present

## 2021-07-17 DIAGNOSIS — G894 Chronic pain syndrome: Secondary | ICD-10-CM | POA: Diagnosis not present

## 2021-07-17 DIAGNOSIS — Z79891 Long term (current) use of opiate analgesic: Secondary | ICD-10-CM | POA: Diagnosis not present

## 2021-07-17 DIAGNOSIS — M79622 Pain in left upper arm: Secondary | ICD-10-CM | POA: Diagnosis not present

## 2021-07-17 DIAGNOSIS — M545 Low back pain, unspecified: Secondary | ICD-10-CM | POA: Diagnosis not present

## 2021-07-17 DIAGNOSIS — M79605 Pain in left leg: Secondary | ICD-10-CM | POA: Diagnosis not present

## 2021-08-14 DIAGNOSIS — M545 Low back pain, unspecified: Secondary | ICD-10-CM | POA: Diagnosis not present

## 2021-08-14 DIAGNOSIS — G894 Chronic pain syndrome: Secondary | ICD-10-CM | POA: Diagnosis not present

## 2021-08-14 DIAGNOSIS — M79662 Pain in left lower leg: Secondary | ICD-10-CM | POA: Diagnosis not present

## 2021-08-14 DIAGNOSIS — M542 Cervicalgia: Secondary | ICD-10-CM | POA: Diagnosis not present

## 2021-08-14 DIAGNOSIS — Z79891 Long term (current) use of opiate analgesic: Secondary | ICD-10-CM | POA: Diagnosis not present

## 2021-08-14 DIAGNOSIS — M79622 Pain in left upper arm: Secondary | ICD-10-CM | POA: Diagnosis not present

## 2021-08-14 DIAGNOSIS — M79605 Pain in left leg: Secondary | ICD-10-CM | POA: Diagnosis not present

## 2021-09-11 DIAGNOSIS — G894 Chronic pain syndrome: Secondary | ICD-10-CM | POA: Diagnosis not present

## 2021-09-11 DIAGNOSIS — M79622 Pain in left upper arm: Secondary | ICD-10-CM | POA: Diagnosis not present

## 2021-09-11 DIAGNOSIS — M542 Cervicalgia: Secondary | ICD-10-CM | POA: Diagnosis not present

## 2021-09-11 DIAGNOSIS — M79605 Pain in left leg: Secondary | ICD-10-CM | POA: Diagnosis not present

## 2021-09-11 DIAGNOSIS — Z79891 Long term (current) use of opiate analgesic: Secondary | ICD-10-CM | POA: Diagnosis not present

## 2021-09-11 DIAGNOSIS — M545 Low back pain, unspecified: Secondary | ICD-10-CM | POA: Diagnosis not present

## 2021-09-11 DIAGNOSIS — M79662 Pain in left lower leg: Secondary | ICD-10-CM | POA: Diagnosis not present

## 2021-11-06 DIAGNOSIS — G894 Chronic pain syndrome: Secondary | ICD-10-CM | POA: Diagnosis not present

## 2021-11-06 DIAGNOSIS — M79662 Pain in left lower leg: Secondary | ICD-10-CM | POA: Diagnosis not present

## 2021-11-06 DIAGNOSIS — M79622 Pain in left upper arm: Secondary | ICD-10-CM | POA: Diagnosis not present

## 2021-11-06 DIAGNOSIS — M545 Low back pain, unspecified: Secondary | ICD-10-CM | POA: Diagnosis not present

## 2021-11-06 DIAGNOSIS — Z79891 Long term (current) use of opiate analgesic: Secondary | ICD-10-CM | POA: Diagnosis not present

## 2021-11-06 DIAGNOSIS — M542 Cervicalgia: Secondary | ICD-10-CM | POA: Diagnosis not present

## 2021-11-06 DIAGNOSIS — M79605 Pain in left leg: Secondary | ICD-10-CM | POA: Diagnosis not present

## 2021-12-04 DIAGNOSIS — M5137 Other intervertebral disc degeneration, lumbosacral region: Secondary | ICD-10-CM | POA: Diagnosis not present

## 2021-12-04 DIAGNOSIS — M542 Cervicalgia: Secondary | ICD-10-CM | POA: Diagnosis not present

## 2021-12-04 DIAGNOSIS — Z79891 Long term (current) use of opiate analgesic: Secondary | ICD-10-CM | POA: Diagnosis not present

## 2021-12-04 DIAGNOSIS — M79605 Pain in left leg: Secondary | ICD-10-CM | POA: Diagnosis not present

## 2021-12-04 DIAGNOSIS — G894 Chronic pain syndrome: Secondary | ICD-10-CM | POA: Diagnosis not present

## 2021-12-04 DIAGNOSIS — M79662 Pain in left lower leg: Secondary | ICD-10-CM | POA: Diagnosis not present

## 2021-12-04 DIAGNOSIS — M545 Low back pain, unspecified: Secondary | ICD-10-CM | POA: Diagnosis not present

## 2022-01-01 DIAGNOSIS — M5137 Other intervertebral disc degeneration, lumbosacral region: Secondary | ICD-10-CM | POA: Diagnosis not present

## 2022-01-01 DIAGNOSIS — M79662 Pain in left lower leg: Secondary | ICD-10-CM | POA: Diagnosis not present

## 2022-01-01 DIAGNOSIS — Z79891 Long term (current) use of opiate analgesic: Secondary | ICD-10-CM | POA: Diagnosis not present

## 2022-01-01 DIAGNOSIS — M545 Low back pain, unspecified: Secondary | ICD-10-CM | POA: Diagnosis not present

## 2022-01-01 DIAGNOSIS — M79605 Pain in left leg: Secondary | ICD-10-CM | POA: Diagnosis not present

## 2022-01-01 DIAGNOSIS — G894 Chronic pain syndrome: Secondary | ICD-10-CM | POA: Diagnosis not present

## 2022-01-01 DIAGNOSIS — M79622 Pain in left upper arm: Secondary | ICD-10-CM | POA: Diagnosis not present

## 2022-01-01 DIAGNOSIS — M542 Cervicalgia: Secondary | ICD-10-CM | POA: Diagnosis not present

## 2022-01-29 DIAGNOSIS — M545 Low back pain, unspecified: Secondary | ICD-10-CM | POA: Diagnosis not present

## 2022-01-29 DIAGNOSIS — M542 Cervicalgia: Secondary | ICD-10-CM | POA: Diagnosis not present

## 2022-01-29 DIAGNOSIS — G894 Chronic pain syndrome: Secondary | ICD-10-CM | POA: Diagnosis not present

## 2022-01-29 DIAGNOSIS — Z79891 Long term (current) use of opiate analgesic: Secondary | ICD-10-CM | POA: Diagnosis not present

## 2022-01-29 DIAGNOSIS — M79605 Pain in left leg: Secondary | ICD-10-CM | POA: Diagnosis not present

## 2022-01-29 DIAGNOSIS — M79662 Pain in left lower leg: Secondary | ICD-10-CM | POA: Diagnosis not present

## 2022-01-29 DIAGNOSIS — M79622 Pain in left upper arm: Secondary | ICD-10-CM | POA: Diagnosis not present

## 2022-01-29 DIAGNOSIS — M5137 Other intervertebral disc degeneration, lumbosacral region: Secondary | ICD-10-CM | POA: Diagnosis not present

## 2022-02-19 DIAGNOSIS — M5137 Other intervertebral disc degeneration, lumbosacral region: Secondary | ICD-10-CM | POA: Diagnosis not present

## 2022-02-19 DIAGNOSIS — M545 Low back pain, unspecified: Secondary | ICD-10-CM | POA: Diagnosis not present

## 2022-02-19 DIAGNOSIS — M79622 Pain in left upper arm: Secondary | ICD-10-CM | POA: Diagnosis not present

## 2022-02-19 DIAGNOSIS — M79662 Pain in left lower leg: Secondary | ICD-10-CM | POA: Diagnosis not present

## 2022-02-19 DIAGNOSIS — M79605 Pain in left leg: Secondary | ICD-10-CM | POA: Diagnosis not present

## 2022-02-19 DIAGNOSIS — M542 Cervicalgia: Secondary | ICD-10-CM | POA: Diagnosis not present

## 2022-02-19 DIAGNOSIS — G894 Chronic pain syndrome: Secondary | ICD-10-CM | POA: Diagnosis not present

## 2022-02-19 DIAGNOSIS — Z79891 Long term (current) use of opiate analgesic: Secondary | ICD-10-CM | POA: Diagnosis not present

## 2022-03-27 DIAGNOSIS — M542 Cervicalgia: Secondary | ICD-10-CM | POA: Diagnosis not present

## 2022-03-27 DIAGNOSIS — M79662 Pain in left lower leg: Secondary | ICD-10-CM | POA: Diagnosis not present

## 2022-03-27 DIAGNOSIS — Z79891 Long term (current) use of opiate analgesic: Secondary | ICD-10-CM | POA: Diagnosis not present

## 2022-03-27 DIAGNOSIS — G894 Chronic pain syndrome: Secondary | ICD-10-CM | POA: Diagnosis not present

## 2022-03-27 DIAGNOSIS — M79605 Pain in left leg: Secondary | ICD-10-CM | POA: Diagnosis not present

## 2022-03-27 DIAGNOSIS — M5137 Other intervertebral disc degeneration, lumbosacral region: Secondary | ICD-10-CM | POA: Diagnosis not present

## 2022-03-27 DIAGNOSIS — M79622 Pain in left upper arm: Secondary | ICD-10-CM | POA: Diagnosis not present

## 2022-03-27 DIAGNOSIS — M545 Low back pain, unspecified: Secondary | ICD-10-CM | POA: Diagnosis not present

## 2022-04-24 DIAGNOSIS — M79605 Pain in left leg: Secondary | ICD-10-CM | POA: Diagnosis not present

## 2022-04-24 DIAGNOSIS — Z79891 Long term (current) use of opiate analgesic: Secondary | ICD-10-CM | POA: Diagnosis not present

## 2022-04-24 DIAGNOSIS — M79622 Pain in left upper arm: Secondary | ICD-10-CM | POA: Diagnosis not present

## 2022-04-24 DIAGNOSIS — M545 Low back pain, unspecified: Secondary | ICD-10-CM | POA: Diagnosis not present

## 2022-04-24 DIAGNOSIS — M542 Cervicalgia: Secondary | ICD-10-CM | POA: Diagnosis not present

## 2022-04-24 DIAGNOSIS — M5137 Other intervertebral disc degeneration, lumbosacral region: Secondary | ICD-10-CM | POA: Diagnosis not present

## 2022-04-24 DIAGNOSIS — G894 Chronic pain syndrome: Secondary | ICD-10-CM | POA: Diagnosis not present

## 2023-04-02 DIAGNOSIS — M79622 Pain in left upper arm: Secondary | ICD-10-CM | POA: Diagnosis not present

## 2023-04-02 DIAGNOSIS — Z79891 Long term (current) use of opiate analgesic: Secondary | ICD-10-CM | POA: Diagnosis not present

## 2023-04-02 DIAGNOSIS — M542 Cervicalgia: Secondary | ICD-10-CM | POA: Diagnosis not present

## 2023-04-02 DIAGNOSIS — M545 Low back pain, unspecified: Secondary | ICD-10-CM | POA: Diagnosis not present

## 2023-04-02 DIAGNOSIS — M79605 Pain in left leg: Secondary | ICD-10-CM | POA: Diagnosis not present

## 2023-04-02 DIAGNOSIS — M79662 Pain in left lower leg: Secondary | ICD-10-CM | POA: Diagnosis not present

## 2023-04-02 DIAGNOSIS — G894 Chronic pain syndrome: Secondary | ICD-10-CM | POA: Diagnosis not present

## 2023-04-02 DIAGNOSIS — M5137 Other intervertebral disc degeneration, lumbosacral region: Secondary | ICD-10-CM | POA: Diagnosis not present

## 2023-04-02 DIAGNOSIS — Z79899 Other long term (current) drug therapy: Secondary | ICD-10-CM | POA: Diagnosis not present

## 2023-04-30 DIAGNOSIS — M79662 Pain in left lower leg: Secondary | ICD-10-CM | POA: Diagnosis not present

## 2023-04-30 DIAGNOSIS — M542 Cervicalgia: Secondary | ICD-10-CM | POA: Diagnosis not present

## 2023-04-30 DIAGNOSIS — M79605 Pain in left leg: Secondary | ICD-10-CM | POA: Diagnosis not present

## 2023-04-30 DIAGNOSIS — M79622 Pain in left upper arm: Secondary | ICD-10-CM | POA: Diagnosis not present

## 2023-04-30 DIAGNOSIS — M5137 Other intervertebral disc degeneration, lumbosacral region: Secondary | ICD-10-CM | POA: Diagnosis not present

## 2023-04-30 DIAGNOSIS — Z79899 Other long term (current) drug therapy: Secondary | ICD-10-CM | POA: Diagnosis not present

## 2023-04-30 DIAGNOSIS — G894 Chronic pain syndrome: Secondary | ICD-10-CM | POA: Diagnosis not present

## 2023-04-30 DIAGNOSIS — Z79891 Long term (current) use of opiate analgesic: Secondary | ICD-10-CM | POA: Diagnosis not present

## 2023-04-30 DIAGNOSIS — M545 Low back pain, unspecified: Secondary | ICD-10-CM | POA: Diagnosis not present

## 2023-05-27 DIAGNOSIS — Z79899 Other long term (current) drug therapy: Secondary | ICD-10-CM | POA: Diagnosis not present

## 2023-05-27 DIAGNOSIS — G894 Chronic pain syndrome: Secondary | ICD-10-CM | POA: Diagnosis not present

## 2023-05-28 DIAGNOSIS — M542 Cervicalgia: Secondary | ICD-10-CM | POA: Diagnosis not present

## 2023-05-28 DIAGNOSIS — Z79891 Long term (current) use of opiate analgesic: Secondary | ICD-10-CM | POA: Diagnosis not present

## 2023-05-28 DIAGNOSIS — M79662 Pain in left lower leg: Secondary | ICD-10-CM | POA: Diagnosis not present

## 2023-05-28 DIAGNOSIS — M545 Low back pain, unspecified: Secondary | ICD-10-CM | POA: Diagnosis not present

## 2023-05-28 DIAGNOSIS — M79622 Pain in left upper arm: Secondary | ICD-10-CM | POA: Diagnosis not present

## 2023-05-28 DIAGNOSIS — G894 Chronic pain syndrome: Secondary | ICD-10-CM | POA: Diagnosis not present

## 2023-05-28 DIAGNOSIS — M5137 Other intervertebral disc degeneration, lumbosacral region: Secondary | ICD-10-CM | POA: Diagnosis not present

## 2023-05-28 DIAGNOSIS — M79605 Pain in left leg: Secondary | ICD-10-CM | POA: Diagnosis not present

## 2023-06-29 DIAGNOSIS — G894 Chronic pain syndrome: Secondary | ICD-10-CM | POA: Diagnosis not present

## 2023-06-29 DIAGNOSIS — Z79891 Long term (current) use of opiate analgesic: Secondary | ICD-10-CM | POA: Diagnosis not present

## 2023-06-29 DIAGNOSIS — M79622 Pain in left upper arm: Secondary | ICD-10-CM | POA: Diagnosis not present

## 2023-06-29 DIAGNOSIS — M79605 Pain in left leg: Secondary | ICD-10-CM | POA: Diagnosis not present

## 2023-06-29 DIAGNOSIS — M542 Cervicalgia: Secondary | ICD-10-CM | POA: Diagnosis not present

## 2023-06-29 DIAGNOSIS — M545 Low back pain, unspecified: Secondary | ICD-10-CM | POA: Diagnosis not present

## 2023-06-29 DIAGNOSIS — M79662 Pain in left lower leg: Secondary | ICD-10-CM | POA: Diagnosis not present

## 2023-06-29 DIAGNOSIS — M5137 Other intervertebral disc degeneration, lumbosacral region: Secondary | ICD-10-CM | POA: Diagnosis not present

## 2023-07-29 DIAGNOSIS — M545 Low back pain, unspecified: Secondary | ICD-10-CM | POA: Diagnosis not present

## 2023-07-29 DIAGNOSIS — M79605 Pain in left leg: Secondary | ICD-10-CM | POA: Diagnosis not present

## 2023-07-29 DIAGNOSIS — M79662 Pain in left lower leg: Secondary | ICD-10-CM | POA: Diagnosis not present

## 2023-07-29 DIAGNOSIS — G894 Chronic pain syndrome: Secondary | ICD-10-CM | POA: Diagnosis not present

## 2023-07-29 DIAGNOSIS — M542 Cervicalgia: Secondary | ICD-10-CM | POA: Diagnosis not present

## 2023-07-29 DIAGNOSIS — M5137 Other intervertebral disc degeneration, lumbosacral region with discogenic back pain only: Secondary | ICD-10-CM | POA: Diagnosis not present

## 2023-07-29 DIAGNOSIS — Z79891 Long term (current) use of opiate analgesic: Secondary | ICD-10-CM | POA: Diagnosis not present

## 2023-07-29 DIAGNOSIS — M79622 Pain in left upper arm: Secondary | ICD-10-CM | POA: Diagnosis not present

## 2023-08-26 DIAGNOSIS — G894 Chronic pain syndrome: Secondary | ICD-10-CM | POA: Diagnosis not present

## 2023-08-26 DIAGNOSIS — Z79891 Long term (current) use of opiate analgesic: Secondary | ICD-10-CM | POA: Diagnosis not present

## 2023-09-23 DIAGNOSIS — Z79891 Long term (current) use of opiate analgesic: Secondary | ICD-10-CM | POA: Diagnosis not present

## 2023-09-23 DIAGNOSIS — M79622 Pain in left upper arm: Secondary | ICD-10-CM | POA: Diagnosis not present

## 2023-09-23 DIAGNOSIS — G894 Chronic pain syndrome: Secondary | ICD-10-CM | POA: Diagnosis not present

## 2023-09-23 DIAGNOSIS — M79605 Pain in left leg: Secondary | ICD-10-CM | POA: Diagnosis not present

## 2023-10-21 DIAGNOSIS — Z79891 Long term (current) use of opiate analgesic: Secondary | ICD-10-CM | POA: Diagnosis not present

## 2023-10-21 DIAGNOSIS — M545 Low back pain, unspecified: Secondary | ICD-10-CM | POA: Diagnosis not present

## 2023-10-21 DIAGNOSIS — M79622 Pain in left upper arm: Secondary | ICD-10-CM | POA: Diagnosis not present

## 2023-10-21 DIAGNOSIS — G894 Chronic pain syndrome: Secondary | ICD-10-CM | POA: Diagnosis not present

## 2023-10-21 DIAGNOSIS — M542 Cervicalgia: Secondary | ICD-10-CM | POA: Diagnosis not present

## 2023-11-25 DIAGNOSIS — Z79891 Long term (current) use of opiate analgesic: Secondary | ICD-10-CM | POA: Diagnosis not present

## 2023-11-25 DIAGNOSIS — M79622 Pain in left upper arm: Secondary | ICD-10-CM | POA: Diagnosis not present

## 2023-11-25 DIAGNOSIS — M79605 Pain in left leg: Secondary | ICD-10-CM | POA: Diagnosis not present

## 2023-11-25 DIAGNOSIS — M545 Low back pain, unspecified: Secondary | ICD-10-CM | POA: Diagnosis not present

## 2023-11-25 DIAGNOSIS — G894 Chronic pain syndrome: Secondary | ICD-10-CM | POA: Diagnosis not present

## 2023-12-28 DIAGNOSIS — M79622 Pain in left upper arm: Secondary | ICD-10-CM | POA: Diagnosis not present

## 2023-12-28 DIAGNOSIS — G894 Chronic pain syndrome: Secondary | ICD-10-CM | POA: Diagnosis not present

## 2023-12-28 DIAGNOSIS — Z79891 Long term (current) use of opiate analgesic: Secondary | ICD-10-CM | POA: Diagnosis not present

## 2023-12-28 DIAGNOSIS — M79605 Pain in left leg: Secondary | ICD-10-CM | POA: Diagnosis not present

## 2023-12-28 DIAGNOSIS — M545 Low back pain, unspecified: Secondary | ICD-10-CM | POA: Diagnosis not present

## 2024-01-27 DIAGNOSIS — G894 Chronic pain syndrome: Secondary | ICD-10-CM | POA: Diagnosis not present

## 2024-01-27 DIAGNOSIS — M545 Low back pain, unspecified: Secondary | ICD-10-CM | POA: Diagnosis not present

## 2024-01-27 DIAGNOSIS — Z79891 Long term (current) use of opiate analgesic: Secondary | ICD-10-CM | POA: Diagnosis not present

## 2024-01-27 DIAGNOSIS — M79662 Pain in left lower leg: Secondary | ICD-10-CM | POA: Diagnosis not present

## 2024-01-27 DIAGNOSIS — M79622 Pain in left upper arm: Secondary | ICD-10-CM | POA: Diagnosis not present

## 2024-02-24 DIAGNOSIS — M545 Low back pain, unspecified: Secondary | ICD-10-CM | POA: Diagnosis not present

## 2024-02-24 DIAGNOSIS — M79622 Pain in left upper arm: Secondary | ICD-10-CM | POA: Diagnosis not present

## 2024-02-24 DIAGNOSIS — Z79891 Long term (current) use of opiate analgesic: Secondary | ICD-10-CM | POA: Diagnosis not present

## 2024-02-24 DIAGNOSIS — G894 Chronic pain syndrome: Secondary | ICD-10-CM | POA: Diagnosis not present

## 2024-02-24 DIAGNOSIS — M79605 Pain in left leg: Secondary | ICD-10-CM | POA: Diagnosis not present

## 2024-03-23 DIAGNOSIS — M79662 Pain in left lower leg: Secondary | ICD-10-CM | POA: Diagnosis not present

## 2024-03-23 DIAGNOSIS — Z79891 Long term (current) use of opiate analgesic: Secondary | ICD-10-CM | POA: Diagnosis not present

## 2024-03-23 DIAGNOSIS — M79622 Pain in left upper arm: Secondary | ICD-10-CM | POA: Diagnosis not present

## 2024-03-23 DIAGNOSIS — M542 Cervicalgia: Secondary | ICD-10-CM | POA: Diagnosis not present

## 2024-03-23 DIAGNOSIS — M79605 Pain in left leg: Secondary | ICD-10-CM | POA: Diagnosis not present

## 2024-03-23 DIAGNOSIS — G894 Chronic pain syndrome: Secondary | ICD-10-CM | POA: Diagnosis not present

## 2024-04-21 DIAGNOSIS — M5137 Other intervertebral disc degeneration, lumbosacral region with discogenic back pain only: Secondary | ICD-10-CM | POA: Diagnosis not present

## 2024-04-21 DIAGNOSIS — G894 Chronic pain syndrome: Secondary | ICD-10-CM | POA: Diagnosis not present

## 2024-04-21 DIAGNOSIS — Z79891 Long term (current) use of opiate analgesic: Secondary | ICD-10-CM | POA: Diagnosis not present

## 2024-04-21 DIAGNOSIS — M79605 Pain in left leg: Secondary | ICD-10-CM | POA: Diagnosis not present

## 2024-04-21 DIAGNOSIS — M545 Low back pain, unspecified: Secondary | ICD-10-CM | POA: Diagnosis not present

## 2024-05-19 DIAGNOSIS — M79622 Pain in left upper arm: Secondary | ICD-10-CM | POA: Diagnosis not present

## 2024-05-19 DIAGNOSIS — M79605 Pain in left leg: Secondary | ICD-10-CM | POA: Diagnosis not present

## 2024-05-19 DIAGNOSIS — Z79891 Long term (current) use of opiate analgesic: Secondary | ICD-10-CM | POA: Diagnosis not present

## 2024-05-19 DIAGNOSIS — G894 Chronic pain syndrome: Secondary | ICD-10-CM | POA: Diagnosis not present

## 2024-05-19 DIAGNOSIS — M5137 Other intervertebral disc degeneration, lumbosacral region with discogenic back pain only: Secondary | ICD-10-CM | POA: Diagnosis not present

## 2024-08-24 ENCOUNTER — Telehealth: Payer: Self-pay

## 2024-08-24 NOTE — Telephone Encounter (Signed)
 [facility] listed as PCP with [employer]. Needs to reestablish care if needed or call [facility] and have them change his PCP on file.
